# Patient Record
Sex: Male | Born: 1995
Health system: Southern US, Community
[De-identification: ages and names within clinical notes are randomized; demographics above are authoritative.]

## PROBLEM LIST (undated history)

## (undated) DIAGNOSIS — E11649 Type 2 diabetes mellitus with hypoglycemia without coma: Secondary | ICD-10-CM

## (undated) DIAGNOSIS — E063 Autoimmune thyroiditis: Secondary | ICD-10-CM

## (undated) DIAGNOSIS — E049 Nontoxic goiter, unspecified: Secondary | ICD-10-CM

## (undated) DIAGNOSIS — N62 Hypertrophy of breast: Secondary | ICD-10-CM

## (undated) HISTORY — DX: Hypertrophy of breast: N62

## (undated) HISTORY — DX: Autoimmune thyroiditis: E06.3

## (undated) HISTORY — DX: Nontoxic goiter, unspecified: E04.9

## (undated) HISTORY — DX: Type 2 diabetes mellitus with hypoglycemia without coma: E11.649

---
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
## 2014-01-08 ENCOUNTER — Encounter: Payer: Self-pay | Admitting: *Deleted

## 2014-01-08 NOTE — Progress Notes (Signed)
On 12/11/13 patient was given 3 novolog vials lot# [id number], exp 4/16. KW

## 2014-01-09 ENCOUNTER — Telehealth: Payer: Self-pay | Admitting: "Endocrinology

## 2014-01-09 NOTE — Telephone Encounter (Signed)
Made in error. David [name] °

## 2014-01-18 ENCOUNTER — Encounter: Payer: Self-pay | Admitting: *Deleted

## 2014-01-18 NOTE — Progress Notes (Signed)
On 01/09/14 patient was given 2 vials of novolog insulin, lot# [id number], exp 11/16. KW

## 2014-01-22 ENCOUNTER — Other Ambulatory Visit: Payer: Self-pay | Admitting: *Deleted

## 2014-01-22 DIAGNOSIS — E038 Other specified hypothyroidism: Secondary | ICD-10-CM

## 2014-01-22 MED ORDER — LEVOTHYROXINE SODIUM 200 MCG PO TABS: ORAL_TABLET | ORAL | Status: DC

## 2014-02-07 ENCOUNTER — Ambulatory Visit (INDEPENDENT_AMBULATORY_CARE_PROVIDER_SITE_OTHER): Payer: BC Managed Care – PPO | Admitting: "Endocrinology

## 2014-02-07 ENCOUNTER — Encounter: Payer: Self-pay | Admitting: "Endocrinology

## 2014-02-07 VITALS — BP 134/80 | HR 89 | Wt 189.0 lb

## 2014-02-07 DIAGNOSIS — E049 Nontoxic goiter, unspecified: Secondary | ICD-10-CM

## 2014-02-07 DIAGNOSIS — E063 Autoimmune thyroiditis: Secondary | ICD-10-CM | POA: Diagnosis not present

## 2014-02-07 DIAGNOSIS — E038 Other specified hypothyroidism: Secondary | ICD-10-CM | POA: Diagnosis not present

## 2014-02-07 DIAGNOSIS — E1065 Type 1 diabetes mellitus with hyperglycemia: Secondary | ICD-10-CM | POA: Diagnosis not present

## 2014-02-07 DIAGNOSIS — E10649 Type 1 diabetes mellitus with hypoglycemia without coma: Secondary | ICD-10-CM

## 2014-02-07 DIAGNOSIS — I10 Essential (primary) hypertension: Secondary | ICD-10-CM

## 2014-02-07 DIAGNOSIS — G990 Autonomic neuropathy in diseases classified elsewhere: Secondary | ICD-10-CM

## 2014-02-07 DIAGNOSIS — E1042 Type 1 diabetes mellitus with diabetic polyneuropathy: Secondary | ICD-10-CM

## 2014-02-07 DIAGNOSIS — IMO0002 Reserved for concepts with insufficient information to code with codable children: Secondary | ICD-10-CM

## 2014-02-07 LAB — T3, FREE: T3 FREE: 6.1 pg/mL — AB (ref 2.3–4.2)

## 2014-02-07 LAB — POCT GLYCOSYLATED HEMOGLOBIN (HGB A1C): HEMOGLOBIN A1C: 10.1

## 2014-02-07 LAB — GLUCOSE, POCT (MANUAL RESULT ENTRY): POC Glucose: 282 mg/dl — AB (ref 70–99)

## 2014-02-07 LAB — T4, FREE: FREE T4: 2.64 ng/dL — AB (ref 0.80–1.80)

## 2014-02-07 LAB — TSH: TSH: 0.009 u[IU]/mL — ABNORMAL LOW (ref 0.350–4.500)

## 2014-02-07 MED ORDER — LEVOTHYROXINE SODIUM 200 MCG PO TABS: ORAL_TABLET | ORAL | Status: DC

## 2014-02-07 NOTE — Progress Notes (Signed)
CC: FU of Type 1 Diabetes mellitus, hypoglycemia, hypothyroid, Hashimoto's Disease, goiter, peripheral neuropathy, and non-compliance.  HPI: David [name] is a 18 y.o. Caucasian young man. He was unaccompanied.  1. David [name] was admitted to [facility] on 12/18/05 for new onset T1DM, dehydration and ketonuria. He was initially treated with a multiple daily injection regimen of Lantus as a basal insulin and Novolog at mealtimes and at other times as needed. He was converted to an insulin pump in 2009. As he has grown to manhood he has required progressively larger doses of insulin. His compliance with checking BGs, giving boluses, and changing sites has varied significantly from time to time. Complications of his diabetes included hypoglycemia and mild peripheral neuropathy.   2. David [name] was also diagnosed with hypothyroidism secondary to Hashimoto's Thyroiditis during this period and was treated with Synthroid. As he has grown older and as the Hashimoto's Disease has gradually but progressively destroyed thyrocytes, his doses of Synthroid have accordingly increased.   3. His last visit to [facility] was on 10/26/13. In the interim he has been healthy. He is taking Synthroid 400 mcg/day (two of the 200 mcg pills) 5 days per week and 600 mg on Saturday and 400 on Sundays each weekend day (instead of 500 mg each weekend day as we had requested). At his last refill request he was inadvertently given levothyroxine. Since then his neck feels bigger and he has had intermittent pains and pressure sensations.  His insulin pump seems to be working well. He did not have his follow up TFTS drawn prior to this visit.    4. Pertinent Review of Systems: Constitutional: David [name] feels "good". He is sleeping better, but is still tired, which he ascribes to working three jobs. He is  taking in about as much caffeine as at last visit. Emotionally, things are better. 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   Eyes: Vision is good. His last diabetic eye exam was in February 2013. There were no signs of diabetic eye disease. He has not yet scheduled a FU exam, but will do so. Neck: The patient has more complaints of anterior neck swelling, soreness, tenderness, and pressure.  Heart: Heart rate is rarely inappropriately fast. Heart rate increases with exercise or other physical activity. The patient has no complaints of palpitations, irregular heat beats, chest pain, or chest pressure. Stamina is good.  Gastrointestinal: Bowel movents seem normal. The patient has no complaints of excessive hunger, acid reflux, upset stomach, stomach aches or pains, diarrhea, or constipation. Legs: Muscle mass and strength seem normal. There are no complaints of numbness, tingling, burning, or pain. No edema is noted. Feet: There are no obvious foot problems. There are no complaints of numbness, tingling, burning, or pain. No edema is noted. Hypoglycemia: David [name] has not had many low BGs. None were severe.      5. BG meter and pump printout: He is changing his insertion site every 3-4 days. He checks BGs 1-4 times per day, mostly 2-3 times. He often misses his breakfast and dinner BG checks. He boluses 0-4 times per day, usually 2-3 times. He once went up to 47 hours between boluses. He does many carb boluses and correction boluses, but not many combined carb and correction boluses. He has had six low BGs, four of which occurred about 1-2 AM after working late and after taking big boluses at dinner or later  in the evening. His average BG was 267, compared with 278 at last visit and with 246 at the prior visit.    PAST MEDICAL, FAMILY, AND SOCIAL HISTORY: 1. School and family: He works 38 hours per week at [employer] and has 2 part-time jobs as noted above. He has benefits at [employer]. 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 Dominie's brother appears to  be in remission from his bone cancer.  2. Activities: He has not been working out much lately.   3. Primary Care Pediatrician: Dr. Maryellen Pile  REVIEW OF SYSTEMS: David [name] does not have any other significant issues involving any other of his body systems.  PHYSICAL EXAM: BP 134/80 mmHg  Pulse 89  Wt 189 lb (85.73 kg)   Constitutional: David [name] appears healthy, trim, fit, and well nourished. He has lost 7 pounds since his last visit, unintentionally.  Eyes: There is no obvious arcus or proptosis. Moisture appears normal. Mouth: The oropharynx and tongue appear normal. Dentition appears to be normal for age. Oral moisture is normal. Neck: The neck appears to be visibly enlarged. No carotid bruits are noted. The thyroid gland is diffusely larger at about 25+ grams in size. The left lobe is again larger and firmer than the right. The consistency of the thyroid gland is relatively firm. The thyroid gland is not tender to palpation. Lungs: The lungs are clear to auscultation. Air movement is good. Heart: Heart rate and rhythm are regular. Heart sounds S1 and S2 are normal. I did not appreciate any pathologic cardiac murmurs. Abdomen: The abdomen is normal in size. Bowel sounds are normal. There is no obvious hepatomegaly, splenomegaly, or other mass effect.  Arms: Muscle size and bulk are normal for age. Hands: There is no obvious tremor. Phalangeal and metacarpophalangeal joints are normal. Palmar muscles are normal for age. Palmar skin is normal. Palmar moisture is also normal. Legs: Muscles appear normal for age. No edema is present. Feet: Feet are normally formed. Dorsalis pedal pulses are 1+ bilaterally.  Neurologic: Strength is normal for age in both the upper and lower extremities. Muscle tone is normal. Sensation to touch is normal in the legs and soles of the feet.  LABS:  Hemoglobin A1c today is 10.1% today, compared with 13.4% at last visit and with 10.3% at the visit prior.  Labs 10/26/13:  TSH 0.025, free T4 1.83, free T3 4.6  Labs 05/23/13: TSH 3.563, free T4 1.56, free T3 3.6, TPO antibody 958   Labs 02/21/13: TSH 8.951, free T4 1.62, free T3 3.3 (on Synthroid 375 mcg/day)  Labs 08/15/12: TSH 3.947, free T4 1.70, free T3 3.2; cholesterol 148, triglycerides 40, HDL 59, LDL 81; microalbumin/creatinine ratio 8.4; CMP normal except glucose 207.  Labs 04/11/12: TSH 20.851, free T4 0.98, free T3 2.2 on prior dose of Synthroid  Labs 12/07/11: TSH 5.658, free T4 1.19, free T3 2.9  Labs 05/13/11: TSH 0.648, Free T4 1.75, Free T3 3.4. Microalbumin/creatinine ratio was 3.0 (normal < 30). CMP was normal, except for BG of 255.  ASSESSMENT:  1. T1DM: The HbA1c is much lower, almost down to the limit set by the [facility]. He is more compliant with BG checks and a bit more compliant with boluses. He is not checking BGs frequently enough on some days to be considered safe for driving. He needs to check BGs at meals and bedtime and to take insulin boluses more consistently. The longer he goes between BG checks and boluses, the more  at risk he is for hyperglycemia and hypoglycemia. He is changing sites more frequently at every 3-4 days. If he waits to change sites until the 4th days, he has many high BGs. 2. Hypoglycemia: He has had more low BGs, especially after taking big correction boluses in the evenings. He needs a higher BG target from 8 PM to midnight. When he plans to sleep in he needs to set a lower temporary basal rate, especially after working physically.    3. Hypothyroid: He was hyperthyroid in May, so we decreased his dose of Synthroid a small amount. He appears clinically euthyroid now. He did not get labs done prior to this visit but will have them done today. 4. Thyroiditis: His thyroiditis has been acting up more frequently in the past month. I suspect that the increase in Hashimoto's disease activity was not due to taking generic levothyroxine.  5. Goiter: The goiter is larger today. The  waxing and waning of thyroid gland size and the varying degrees of firmness are consistent with evolving Hashimoto's disease activity. 6. Peripheral neuropathy: Peripheral neuropathy is not evident today. If he can control his BGs better the neuropathy will not recur. 7. Hypertension: His BP is a bit higher. He filled the prescription for lisinopril, but is not taking it. He has heard horror stories about taking BP meds and is afraid to do so. I've told him that he is on the lowest dose of any of the popular BP meds. In his case, the lisinopril will not only control BP, but will also independently protect the kidneys from the effects of high BGs. He grudgingly agrees to start the lisinopril. 8. Non-compliance: He is doing better, but still can't consistently do all that he needs to do to take care of his DM.  PLAN: 1. Diagnostic: Repeat TFTs today. Call in two weeks with BG report. 2. Therapeutic: Adjust his Synthroid plan as needed.  Start lisinopril, 2.5 mg/day. Continue lower caffeine intake. Change the evening BG target to 180 from 8 PM to midnight.  3. Patient education: BGs will improve further if he does a better a better job of performing his DM self-care tasks.  4. Follow-up: Follow-up in 3 months.  Level of Service: This visit lasted in excess of 60 minutes. More than 50% of the visit was devoted to counseling.  David StallBRENNAN,Haydyn Liddell J

## 2014-02-07 NOTE — Patient Instructions (Signed)
Follow up visit in 3 months. Please call in on a Wednesday or Sunday evening in two weeks to discuss BGs.

## 2014-02-09 ENCOUNTER — Other Ambulatory Visit: Payer: Self-pay | Admitting: *Deleted

## 2014-02-09 DIAGNOSIS — E063 Autoimmune thyroiditis: Secondary | ICD-10-CM

## 2014-04-09 ENCOUNTER — Other Ambulatory Visit: Payer: Self-pay | Admitting: *Deleted

## 2014-04-09 DIAGNOSIS — E034 Atrophy of thyroid (acquired): Secondary | ICD-10-CM

## 2014-04-17 ENCOUNTER — Telehealth: Payer: Self-pay | Admitting: "Endocrinology

## 2014-04-17 NOTE — Telephone Encounter (Signed)
Spoke to patient, samples of Novolog placed at the front. KW

## 2014-04-27 ENCOUNTER — Encounter: Payer: Self-pay | Admitting: *Deleted

## 2014-04-27 NOTE — Progress Notes (Signed)
On February 07, 2014 patient was given 3 sample Novolog vials, lot# [id number], exp 11/16

## 2014-05-17 ENCOUNTER — Ambulatory Visit: Payer: Self-pay | Admitting: "Endocrinology

## 2014-06-26 ENCOUNTER — Ambulatory Visit (INDEPENDENT_AMBULATORY_CARE_PROVIDER_SITE_OTHER): Payer: BLUE CROSS/BLUE SHIELD | Admitting: "Endocrinology

## 2014-06-26 ENCOUNTER — Ambulatory Visit: Payer: Self-pay | Admitting: "Endocrinology

## 2014-06-26 ENCOUNTER — Encounter: Payer: Self-pay | Admitting: "Endocrinology

## 2014-06-26 VITALS — BP 137/72 | HR 87 | Wt 188.7 lb

## 2014-06-26 DIAGNOSIS — E1065 Type 1 diabetes mellitus with hyperglycemia: Secondary | ICD-10-CM

## 2014-06-26 DIAGNOSIS — G990 Autonomic neuropathy in diseases classified elsewhere: Secondary | ICD-10-CM

## 2014-06-26 DIAGNOSIS — E038 Other specified hypothyroidism: Secondary | ICD-10-CM | POA: Diagnosis not present

## 2014-06-26 DIAGNOSIS — IMO0002 Reserved for concepts with insufficient information to code with codable children: Secondary | ICD-10-CM

## 2014-06-26 DIAGNOSIS — E10649 Type 1 diabetes mellitus with hypoglycemia without coma: Secondary | ICD-10-CM | POA: Diagnosis not present

## 2014-06-26 DIAGNOSIS — I10 Essential (primary) hypertension: Secondary | ICD-10-CM | POA: Diagnosis not present

## 2014-06-26 DIAGNOSIS — E1042 Type 1 diabetes mellitus with diabetic polyneuropathy: Secondary | ICD-10-CM

## 2014-06-26 DIAGNOSIS — E063 Autoimmune thyroiditis: Secondary | ICD-10-CM

## 2014-06-26 DIAGNOSIS — E049 Nontoxic goiter, unspecified: Secondary | ICD-10-CM

## 2014-06-26 LAB — POCT GLYCOSYLATED HEMOGLOBIN (HGB A1C): HEMOGLOBIN A1C: 10.3

## 2014-06-26 LAB — GLUCOSE, POCT (MANUAL RESULT ENTRY): POC Glucose: 345 mg/dl — AB (ref 70–99)

## 2014-06-26 NOTE — Progress Notes (Signed)
CC: FU of Type 1 Diabetes mellitus, hypoglycemia, hypothyroid, Hashimoto's Disease, goiter, peripheral neuropathy, and non-compliance.  HPI: David [name] is a 19 y.o. Caucasian young man. He was unaccompanied.  1. David [name] was [facility] for new onset T1DM, dehydration and ketonuria. He was initially treated with a multiple daily injection regimen of Lantus as a basal insulin and Novolog at mealtimes and at other times as needed. He was converted to an insulin pump in 2009. As he has grown to manhood he has required progressively larger doses of insulin. His compliance with checking BGs, giving boluses, and changing sites has varied significantly from time to time. Complications of his diabetes include hypoglycemia and mild peripheral neuropathy.   2. David [name] was also diagnosed with hypothyroidism secondary to Hashimoto's Thyroiditis during this period and was treated with Synthroid. As he has grown older and as the Hashimoto's Disease has gradually but progressively destroyed thyrocytes, his doses of Synthroid have accordingly increased.   3. His last visit to [facility] was on 02/07/14. In the interim he has been healthy. He lost his lisinopril prescription and did not take action to obtain a new scrip. He also did not receive our voice mail message asking him to reduce his Synthroid dose to 400 mcg/day, so he has continued taking Synthroid 400 mcg/day (two of the 200 mcg pills) 5 days per week and 500 mg on Saturdays and Sundays. He thinks that his thyroid gland is still enlarged.  His insulin pump seems to be working well.     4. Pertinent Review of Systems: Constitutional: David [name] feels "pretty good". He is sleeping well. He has not been too tired. He is still taking in a fair amount of caffeine at work, but much less when he is not working. Emotionally, things are better. 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    Eyes: Vision is good. His last diabetic eye exam was in late March. There were no signs of diabetic eye disease.  Neck: The patient has not had much anterior neck swelling recently.  Heart: Heart often beats faster and harder. Heart rate increases with exercise or other physical activity. The patient has no complaints of   irregular heat beats, chest pain, or chest pressure. Stamina is good.  Gastrointestinal: Bowel movents seem normal. The patient has no complaints of excessive hunger, acid reflux, upset stomach, stomach aches or pains, diarrhea, or constipation. Legs: Muscle mass and strength seem normal. There are no complaints of numbness, tingling, burning, or pain. No edema is noted. Feet: There are no obvious foot problems. There are no complaints of numbness, tingling, burning, or pain. No edema is noted. Hypoglycemia: David [name] has not had many low BGs. None were severe.      5. BG meter and pump printout: He is changing his insertion sites every 4-5 days. He checks BGs 0-4 times per day, mostly 2-3 times. He often misses his mealtime and bedtime BG checks. He boluses 0-3 times per day. He often goes for up to 36 hours without checking  BGs or giving boluses. He does many manual boluses, carb boluses and correction boluses, but not many combined carb and correction boluses. He has had five low BGs, four of which occurred as the first BG of the day. His average BG was 274, compared with 267 at last visit and with 278 at the prior visit.  BG  range is 55->400.  PAST MEDICAL, FAMILY, AND SOCIAL HISTORY: 1. School and family: He works 38 hours per week at [employer] and has 2 part-time jobs as noted above. He has health insurance benefits at [facility] [employer] [facility]. 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 David [name]'s brother, David [name], appears to be in remission from his bone cancer. David [name] attends [employer]. 2. Activities: David [name] has not been working out much lately.   3. Primary  Care Pediatrician: Dr. Maryellen Pile  REVIEW OF SYSTEMS: David [name] does not have any other significant issues involving any other of his body systems.  PHYSICAL EXAM: BP 137/72 mmHg  Pulse 87  Wt 188 lb 11.2 oz (85.594 kg)   Constitutional: David [name] appears healthy, trim, fit, and well nourished. He also looks tired. He has lost one pound since his last visit.  Eyes: There is no obvious arcus or proptosis. Moisture appears normal. Mouth: The oropharynx and tongue appear normal. Dentition appears to be normal for age. Oral moisture is normal. Neck: The neck appears to be visibly enlarged. No carotid bruits are noted. His strap muscles are quite large. The thyroid gland is probably smaller at about 22-23 grams in size. The consistency of the thyroid gland is relatively firm. The thyroid gland is not tender to palpation. Lungs: The lungs are clear to auscultation. Air movement is good. Heart: Heart rate and rhythm are regular. Heart sounds S1 and S2 are normal. I did not appreciate any pathologic cardiac murmurs. Abdomen: The abdomen is normal in size. Bowel sounds are normal. There is no obvious hepatomegaly, splenomegaly, or other mass effect.  Arms: Muscle size and bulk are normal for age. Hands: There is no obvious tremor. Phalangeal and metacarpophalangeal joints are normal. Palmar muscles are normal for age. Palmar skin is normal. Palmar moisture is also normal. Legs: Muscles appear normal for age. No edema is present. Feet: Feet are normally formed. Dorsalis pedal pulses are 1+ bilaterally.  Neurologic: Strength is normal for age in both the upper and lower extremities. Muscle tone is normal. Sensation to touch is normal in the legs and soles of the feet.  LABS:  Hemoglobin A1c today is 10.3% today, compared with 10.1% at last visit and with 13.4% at the visit prior.  Labs 06/26/14: Pending  Labs 02/07/14: TSH < 0.009, free T4 2.64, free T3 6.1  Labs 10/26/13: TSH 0.025, free T4 1.83, free T3  4.6  Labs 05/23/13: TSH 3.563, free T4 1.56, free T3 3.6, TPO antibody 958   Labs 02/21/13: TSH 8.951, free T4 1.62, free T3 3.3 (on Synthroid 375 mcg/day)  Labs 08/15/12: TSH 3.947, free T4 1.70, free T3 3.2; cholesterol 148, triglycerides 40, HDL 59, LDL 81; microalbumin/creatinine ratio 8.4; CMP normal except glucose 207.  Labs 04/11/12: TSH 20.851, free T4 0.98, free T3 2.2 on prior dose of Synthroid  Labs 12/07/11: TSH 5.658, free T4 1.19, free T3 2.9  Labs 05/13/11: TSH 0.648, Free T4 1.75, Free T3 3.4. Microalbumin/creatinine ratio was 3.0 (normal < 30). CMP was normal, except for BG of 255.  ASSESSMENT:  1. T1DM: The HbA1c is a bit higher. He has not been as compliant with BG checks and insulin boluses in the past month.  He needs to check BGs at meals and bedtime and to take insulin boluses more consistently. The longer he goes between BG checks and boluses, the more at risk he is for hyperglycemia and hypoglycemia. He is changing sites less frequently at every 4-5 days. If he  waits to change sites until the 4th days, he has many high BGs. 2. Hypoglycemia: He has had several low BGs, mostly if he does not check BGs often enough and sleeps in later than usual. When he plans to sleep in he needs to set a lower temporary basal rate, especially after working physically.    3. Hypothyroid: He was hyperthyroid in May, so we decreased his dose of Synthroid a small amount. He was again hyperthyroid in December, but he was also having a flare up of Hashimoto's disease at the time.  He appears clinically euthyroid now. He did not get labs done prior to this visit but will have them done today. 4. Thyroiditis: His thyroiditis has been fairly quiescent recently.   5. Goiter: The goiter is smaller today. The waxing and waning of thyroid gland size and the varying degrees of firmness are consistent with evolving Hashimoto's disease activity. 6. Peripheral neuropathy: Peripheral neuropathy is not evident  today. If he can control his BGs better the neuropathy will not recur. 7. Hypertension: His BP is a bit better today. He did not start on lisinopril, but will do so today.  8. Non-compliance: He is doing better, but still can't consistently do all that he needs to do to take care of his DM. I suggested setting alarms on his cell phone so that he will not forget to check BGs at mealtimes and bedtime.   PLAN: 1. Diagnostic: Repeat TFTs today. Call in two weeks on a Sunday evening with BG report. 2. Therapeutic: Adjust his Synthroid plan as needed.  Start lisinopril, 2.5 mg/day. Continue lower caffeine intake.  3. Patient education: BGs will improve further if he does a better a better job of performing his DM self-care tasks.  4. Follow-up: Follow-up in 3 months.  Level of Service: This visit lasted in excess of 60 minutes. More than 50% of the visit was devoted to counseling.  David Stall

## 2014-06-26 NOTE — Patient Instructions (Signed)
Follow up visit in 3 months. Please call Dr. Fransico MichaelBrennan in two weeks on a Sunday evening between 7-10 PM to discuss BGs.

## 2014-06-27 LAB — T3, FREE: T3, Free: 5.1 pg/mL — ABNORMAL HIGH (ref 2.3–4.2)

## 2014-06-27 LAB — TSH: TSH: <0.008 u[IU]/mL — ABNORMAL LOW (ref 0.350–4.500)

## 2014-06-27 LAB — HEMOGLOBIN A1C
Hgb A1c MFr Bld: 10.6 % — ABNORMAL HIGH (ref <5.7)
Mean Plasma Glucose: 258 mg/dL — ABNORMAL HIGH (ref <117)

## 2014-06-27 LAB — T4, FREE: Free T4: 2.21 ng/dL — ABNORMAL HIGH (ref 0.80–1.80)

## 2014-07-03 ENCOUNTER — Telehealth: Payer: Self-pay | Admitting: *Deleted

## 2014-07-03 ENCOUNTER — Other Ambulatory Visit: Payer: Self-pay | Admitting: *Deleted

## 2014-07-03 DIAGNOSIS — E034 Atrophy of thyroid (acquired): Secondary | ICD-10-CM

## 2014-07-03 NOTE — Telephone Encounter (Signed)
LVM, advised that per Dr. Fransico MichaelBrennan TFTs are still high. Reduce Synthroid to 400 mcg per day on 4 days per week and 300 mcg/day on three days per week, such as Monday-Wednesday-Friday. Repeat TFTs in 3 months, labs are in the portal.

## 2014-07-20 ENCOUNTER — Other Ambulatory Visit: Payer: Self-pay | Admitting: "Endocrinology

## 2014-10-01 ENCOUNTER — Ambulatory Visit: Payer: Self-pay | Admitting: "Endocrinology

## 2014-10-01 ENCOUNTER — Other Ambulatory Visit: Payer: Self-pay | Admitting: "Endocrinology

## 2014-10-27 ENCOUNTER — Other Ambulatory Visit: Payer: Self-pay | Admitting: "Endocrinology

## 2014-11-08 ENCOUNTER — Encounter: Payer: Self-pay | Admitting: "Endocrinology

## 2014-11-08 ENCOUNTER — Ambulatory Visit (INDEPENDENT_AMBULATORY_CARE_PROVIDER_SITE_OTHER): Payer: BLUE CROSS/BLUE SHIELD | Admitting: "Endocrinology

## 2014-11-08 VITALS — BP 123/72 | HR 72 | Wt 187.7 lb

## 2014-11-08 DIAGNOSIS — E10649 Type 1 diabetes mellitus with hypoglycemia without coma: Secondary | ICD-10-CM | POA: Diagnosis not present

## 2014-11-08 DIAGNOSIS — IMO0002 Reserved for concepts with insufficient information to code with codable children: Secondary | ICD-10-CM

## 2014-11-08 DIAGNOSIS — E038 Other specified hypothyroidism: Secondary | ICD-10-CM | POA: Diagnosis not present

## 2014-11-08 DIAGNOSIS — E0580 Other thyrotoxicosis without thyrotoxic crisis or storm: Secondary | ICD-10-CM

## 2014-11-08 DIAGNOSIS — Z9119 Patient's noncompliance with other medical treatment and regimen: Secondary | ICD-10-CM

## 2014-11-08 DIAGNOSIS — Z91199 Patient's noncompliance with other medical treatment and regimen due to unspecified reason: Secondary | ICD-10-CM

## 2014-11-08 DIAGNOSIS — E1065 Type 1 diabetes mellitus with hyperglycemia: Secondary | ICD-10-CM | POA: Diagnosis not present

## 2014-11-08 DIAGNOSIS — E063 Autoimmune thyroiditis: Secondary | ICD-10-CM

## 2014-11-08 DIAGNOSIS — E1042 Type 1 diabetes mellitus with diabetic polyneuropathy: Secondary | ICD-10-CM

## 2014-11-08 DIAGNOSIS — F4320 Adjustment disorder, unspecified: Secondary | ICD-10-CM

## 2014-11-08 DIAGNOSIS — I10 Essential (primary) hypertension: Secondary | ICD-10-CM

## 2014-11-08 DIAGNOSIS — E049 Nontoxic goiter, unspecified: Secondary | ICD-10-CM

## 2014-11-08 DIAGNOSIS — G990 Autonomic neuropathy in diseases classified elsewhere: Secondary | ICD-10-CM

## 2014-11-08 LAB — POCT GLYCOSYLATED HEMOGLOBIN (HGB A1C): HEMOGLOBIN A1C: 9.9

## 2014-11-08 LAB — GLUCOSE, POCT (MANUAL RESULT ENTRY): POC Glucose: 156 mg/dl — AB (ref 70–99)

## 2014-11-08 NOTE — Progress Notes (Signed)
CC: FU of Type 1 Diabetes mellitus, hypoglycemia, acquired autoimmune hypothyroidism, Hashimoto's Disease, goiter, peripheral neuropathy, and adjustment reaction/noncompliance.  HPI: [name] is a 19 y.o. Caucasian young man. He was unaccompanied.  1. [name] was admitted to [facility] on 12/18/05 for new onset T1DM, dehydration and ketonuria. He was initially treated with a multiple daily injection regimen of Lantus as a basal insulin and Novolog at mealtimes and at other times as needed. He was converted to an insulin pump in 2009. As he has grown to manhood he has required progressively larger doses of insulin. His compliance with checking BGs, giving boluses, and changing sites has varied significantly from time to time. Complications of his diabetes include hypoglycemia and mild peripheral neuropathy.   2. [name] was also diagnosed with hypothyroidism secondary to Hashimoto's Thyroiditis during this period and was treated with Synthroid. As he has grown older and as the Hashimoto's Disease has gradually but progressively destroyed thyrocytes, his doses of Synthroid have accordingly increased.   3. His last visit to [facility] was on 06/26/14. In the interim he has been healthy. He lis taking his lisinopril, 2.5 mg/day. After receiving our voicemail message on 07/02/13 he reduced his Synthroid dose to 400 mcg/day for 4 days each week and 300 mg/day on three days each week. He has not noted more fatigue since reducing the Synthroid dose. His insulin pump seems to be working well.     4. Pertinent Review of Systems: Constitutional: [name] feels "pretty good". He is sleeping well. He has not been too tired. 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 He has reduced his caffeine intake quite a bit. Emotionally, things are pretty good. He is working full-time at [employer] station in [employer] [place] and part-time at [employer] station in [place]. He is looking for more part-time work.     Eyes: Vision is good.  His last diabetic eye exam was in late March 2016. There were no signs of diabetic eye disease.  Neck: The patient has not had much anterior neck swelling recently.  Heart: Heart often beats faster and harder. Heart rate increases with exercise or other physical activity. The patient has no complaints of irregular heat beats, chest pain, or chest pressure. Stamina is good.  Gastrointestinal: Bowel movents seem normal. The patient has no complaints of excessive hunger, acid reflux, upset stomach, stomach aches or pains, diarrhea, or constipation. Legs: Muscle mass and strength seem normal. There are no complaints of numbness, tingling, burning, or pain. No edema is noted. Feet: There are no obvious foot problems. There are no complaints of numbness, tingling, burning, or pain. No edema is noted. Hypoglycemia: [name] has not had many low BGs. None were severe.      5. BG meter and pump printout: He is changing his insertion sites every 3-4 days, mostly every 4 days. He checks BGs 0-4 times per day, mostly 2-3 times. He often misses his mealtime and bedtime BG checks. He boluses 0-4 times per day, mostly 1-2 times per day . He sometimes goes for up to 48 hours without checking  BGs or giving boluses. He does many manual boluses, carb boluses and correction boluses, but not many combined carb and correction boluses. He has had 13 low BGs, 7 of which occurred as the first BG of the day. His average BG was 199, compared with 274 at last visit and with 267 at the prior visit.  BG range is 45 - >400.  PAST MEDICAL,  FAMILY, AND SOCIAL HISTORY: 1. School and family: He now works full-time for the [employer]. 2. Activities: [name] is more physically active at work now. He does not use alcohol or illicit drugs.   3. Primary Care Pediatrician: none  REVIEW OF SYSTEMS: [name] does not have any other significant issues involving any of his other body systems.  PHYSICAL EXAM: BP 123/72 mmHg   Pulse 72  Wt 187 lb 11.2 oz (85.14 kg)   Constitutional: [name] appears healthy, trim, fit, and well nourished. He has lost 4 oz. since his last visit. He looks rested today.   Eyes: There is no obvious arcus or proptosis. Moisture appears normal. Mouth: The oropharynx and tongue appear normal. Dentition appears to be normal for age. Oral moisture is normal. Neck: The neck appears to be visibly enlarged. No carotid bruits are noted. His strap muscles are quite large. The thyroid gland is larger at 23-24 grams in size. The consistency of the thyroid gland is relatively firm. The thyroid gland is not tender to palpation. Lungs: The lungs are clear to auscultation. Air movement is good. Heart: Heart rate and rhythm are regular. Heart sounds S1 and S2 are normal. I did not appreciate any pathologic cardiac murmurs. Abdomen: The abdomen is normal in size. Bowel sounds are normal. There is no obvious hepatomegaly, splenomegaly, or other mass effect.  Arms: Muscle size and bulk are normal for age. Hands: There is no obvious tremor. Phalangeal and metacarpophalangeal joints are normal. Palmar muscles are normal for age. Palmar skin is normal. Palmar moisture is also normal. Legs: Muscles appear normal for age. No edema is present. Feet: Feet are normally formed. Dorsalis pedal pulses are 1+ bilaterally.  Neurologic: Strength is normal for age in both the upper and lower extremities. Muscle tone is normal. Sensation to touch is normal in the legs and soles of the feet.  LABS:    Labs 11/08/14: HA1c 9.9%  Labs 10/25/14: CBC normal, except for WBC 3.2 and neutrophil count 1.2; CMP normal except for glucose 162 and bilirubin 2.0; cholesterol 118, triglycerides 40, HDL 48, LDL 62; TSH 0.005, T4 14.2 (4.5-12.00)  Labs 06/26/14: Hemoglobin A1c 10.3% today, compared with 10.1% at last visit and with 13.4% at the visit prior. TSH < 0.008, free T4 2.21, free T3 5.1  Labs 02/07/14: TSH < 0.009, free T4 2.64, free  T3 6.1  Labs 10/26/13: TSH 0.025, free T4 1.83, free T3 4.6  Labs 05/23/13: TSH 3.563, free T4 1.56, free T3 3.6, TPO antibody 958   Labs 02/21/13: TSH 8.951, free T4 1.62, free T3 3.3 (on Synthroid 375 mcg/day)  Labs 08/15/12: TSH 3.947, free T4 1.70, free T3 3.2; cholesterol 148, triglycerides 40, HDL 59, LDL 81; microalbumin/creatinine ratio 8.4; CMP normal except glucose 207.  Labs 04/11/12: TSH 20.851, free T4 0.98, free T3 2.2 on prior dose of Synthroid  Labs 12/07/11: TSH 5.658, free T4 1.19, free T3 2.9  Labs 05/13/11: TSH 0.648, Free T4 1.75, Free T3 3.4. Microalbumin/creatinine ratio was 3.0 (normal < 30). CMP was normal, except for BG of 255.  ASSESSMENT:  1. T1DM: The HbA1c is lower, in part due to him having more frequent low BGs. He has not been as compliant with BG checks and insulin boluses in the past month as he needed to be.  He needs to check BGs at meals and bedtime and to take insulin boluses more consistently. The longer he goes between BG checks and boluses, the more at  risk he is for hyperglycemia and hypoglycemia. He is changing sites more frequently at every 3-4 days. If he waits to change sites until the 4th days, he has many high BGs. 2. Hypoglycemia: He has had many more low BGs, partly due to sometimes not checking BGs often enough late at night or during the days, but also partly due to being more physically active at work. Marland KitchenHe needs lower basal rates throughout the 24-hour period.     3. Hypothyroid: He was hyperthyroid in April, so we decreased his dose of Synthroid dose. His recent thyroid tests two weeks ago are still hyperthyroid, so we need to reduce his Synthroid dose further. He appears clinically euthyroid now. He appears to be taking his Synthroid consistently now.  4. Thyroiditis: His thyroiditis has been fairly quiescent recently.   5. Goiter: The goiter is larger today. The process of waxing and waning of thyroid gland size and degrees of firmness are  consistent with evolving Hashimoto's disease activity. 6. Peripheral neuropathy: Peripheral neuropathy is not evident today. If he can control his BGs better the neuropathy will not recur. 7. Hypertension: His BP is good today on low dose lisinopril therapy.   8. Non-compliance: He is doing somewhat better, but is still not as consistent as he needs to be. I again suggested setting alarms on his cell phone so that he will not forget to check BGs at mealtimes and bedtime. I also suggested several other prompts such as having a 3x5 card in his wallet, a note in his lunch box, and another reminder at his place on the kitchen table.  He said that he will do so. If he does check his BGs and take boluses more frequently, I suspect that we will need to reduce his basal rates and bolus doses more.   PLAN: 1. Diagnostic: Call on 11/21/14 to discuss BGs. Repeat TFTS in 2 months. 2. Therapeutic: Reduce the Synthroid to 300 mcg/day. Adjust his Synthroid plan as needed.  Continue lisinopril, 2.5 mg/day. Continue lower caffeine intake.  New basal rates: MN: 1.25 -> 1.05 4:30 AM: 1.90 -> 1.70 8 AM: 1.30 -> 1.20 2 PM: 1.55 -> 1.40 8 PM: 1.35 -> 1.25 3. Patient education: BGs will improve further if he does a better a better job of performing his DM self-care tasks.  4. Follow-up: Follow-up with me in two months.  Level of Service: This visit lasted in excess of 70 minutes. More than 50% of the visit was devoted to counseling.  David Stall

## 2014-11-08 NOTE — Patient Instructions (Addendum)
Follow up visit with me in two months. Please call Dr.Kristine Tiley between 8:00-9:30 PM on 11/21/14 to discuss BGs.

## 2014-11-21 ENCOUNTER — Telehealth: Payer: Self-pay | Admitting: "Endocrinology

## 2014-11-21 NOTE — Telephone Encounter (Signed)
Received telephone call from [name] 1. Overall status: Things are pretty good. He gives himself correction boluses based upon the BG level, whether it is preprandial or postprandial. He does not do carb counts and food boluses. 2. New problems: He had a 67 in the morning on 11/17/14. 3. Last site change: 11/20/14 4. Rapid-acting insulin: Novolog in his Medtronic pump 5. BG log: 2 AM, Breakfast, Lunch, Supper, Bedtime 11/19/14: xxx, 185, postprandial 271; postprandial 203, pending 11/20/14: xxx, 196, xxx, 209, xxx 11/21/14: xxx, xxx, postprandial 253, 313, pending 6. Assessment: His current method for bolusing is not working well. 7. Plan: Check BGs before meals and do both a correction bolus and a food bolus. 8. FU call: next Wednesday. David Stall

## 2014-12-17 ENCOUNTER — Ambulatory Visit (INDEPENDENT_AMBULATORY_CARE_PROVIDER_SITE_OTHER): Payer: Commercial Managed Care - HMO | Admitting: Family

## 2014-12-17 ENCOUNTER — Encounter: Payer: Self-pay | Admitting: Family

## 2014-12-17 VITALS — BP 124/75 | HR 77 | Wt 186.6 lb

## 2014-12-17 DIAGNOSIS — E162 Hypoglycemia, unspecified: Secondary | ICD-10-CM | POA: Diagnosis not present

## 2014-12-17 DIAGNOSIS — Z9119 Patient's noncompliance with other medical treatment and regimen: Secondary | ICD-10-CM | POA: Diagnosis not present

## 2014-12-17 DIAGNOSIS — E049 Nontoxic goiter, unspecified: Secondary | ICD-10-CM

## 2014-12-17 DIAGNOSIS — E109 Type 1 diabetes mellitus without complications: Secondary | ICD-10-CM | POA: Diagnosis not present

## 2014-12-17 DIAGNOSIS — Z91199 Patient's noncompliance with other medical treatment and regimen due to unspecified reason: Secondary | ICD-10-CM

## 2014-12-17 DIAGNOSIS — Z23 Encounter for immunization: Secondary | ICD-10-CM

## 2014-12-17 LAB — GLUCOSE, POCT (MANUAL RESULT ENTRY): POC GLUCOSE: 236 mg/dL — AB (ref 70–99)

## 2014-12-17 NOTE — Patient Instructions (Addendum)
Goals  - Check blood sugar 4 times per day  - Add blood sugar into bolus wizard to give correction.  - Goal A1C below 9 to start!  - Labs prior to next.   MyChart message me in one week with you blood sugars.

## 2014-12-17 NOTE — Progress Notes (Signed)
Patient ID: David [name], male   DOB: 1996/02/10, 19 y.o.   MRN: [id number]  Expand All Collapse All   CC: FU of Type 1 Diabetes mellitus, hypoglycemia, acquired autoimmune hypothyroidism, Hashimoto's Disease, goiter, peripheral neuropathy, and adjustment reaction/noncompliance.  HPI: David is a 19 y.o. Caucasian young man. He was unaccompanied.  1. David was admitted to [facility] on 12/18/05 for new onset T1DM, dehydration and ketonuria. He was initially treated with a multiple daily injection regimen of Lantus as a basal insulin and Novolog at mealtimes and at other times as needed. He was converted to an insulin pump in 2009. As he has grown to manhood he has required progressively larger doses of insulin. His compliance with checking BGs, giving boluses, and changing sites has varied significantly from time to time. Complications of his diabetes include hypoglycemia and mild peripheral neuropathy.   2. David was also diagnosed with hypothyroidism secondary to Hashimoto's Thyroiditis during this period and was treated with Synthroid. As he has grown older and as the Hashimoto's Disease has gradually but progressively destroyed thyrocytes, his doses of Synthroid have accordingly increased.   3. His last visit to [facility] was on 09/16. In the interim he has been healthy. He has continued to take his lisinopril and also his reduced dose of synthroid. He feels that his blood sugars have been worse since he was last seen, he feels that he has been higher due to having his basal rate reduced. Upon discussing this more with David, he admits that he frequently does not check his blood sugars when eating and only doses his insulin to cover carbs, he even forgets to do this at times. He knows this is a bad habit and it is negatively impacting his diabetes care and his work as a IT sales professional.  He lis taking his lisinopril, 2.5 mg/day. After receiving our voicemail message on 07/02/13 he reduced his Synthroid dose to  400 mcg/day for 4 days each week and 300 mg/day on three days each week. He has not noted more fatigue since reducing the Synthroid dose. His insulin pump seems to be working well.David states that he sometimes runs his blood sugars higher because he is afraid that he will go low while out on a call, however, he always remembers to carry glucose. He states that his food mainly consist of fast food for lunch and dinner and [employer] for breakfast. He exercises for about an hour 4 days a week.   4. Pertinent Review of Systems: Constitutional: David feels "pretty good". He is sleeping well. He has not been too tired. 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 He has reduced his caffeine intake quite a bit. Emotionally, things are pretty good. He is working full-time at [employer] station in [employer] and part-time at [employer] station in [place]. He is looking for more part-time work.  Eyes: Vision is good. His last diabetic eye exam was in late July 2016. There were no signs of diabetic eye disease.  Neck: The patient has not had much anterior neck swelling recently.  Heart: Heart often beats faster and harder. Heart rate increases with exercise or other physical activity. The patient has no complaints of irregular heat beats, chest pain, or chest pressure. Stamina is good.  Gastrointestinal: Bowel movents seem normal. The patient has no complaints of excessive hunger, acid reflux, upset stomach, stomach aches or pains, diarrhea, or constipation. Legs: Muscle mass and strength seem normal. There are no complaints  of numbness, tingling, burning, or pain. No edema is noted. Feet: There are no obvious foot problems. There are no complaints of numbness, tingling, burning, or pain. No edema is noted. Hypoglycemia: David has not had many low BGs. None were severe.   5. BG meter and pump printout: He checks 1-5 times per day, average of 2. 4 test per day. He admits to not checking  before meals frequently. He is bolusing 2.2 times per day. He changes his site every 3-4 days. He has 5 low blood glucose recorded, only one is below 70. He is above target 64% of the time. His basal insulin is 68% of his total daily insulin. Average blood glucose is 241 +-114. Range is 65- >400   PAST MEDICAL, FAMILY, AND SOCIAL HISTORY: 1. School and family: He now works full-time for the [employer]. 2. Activities: David is more physically active at work now. He does not use alcohol or illicit drugs.  3. Primary Care Pediatrician: none  REVIEW OF SYSTEMS: David does not have any other significant issues involving any of his other body systems.  PHYSICAL EXAM: Blood pressure 124/75, pulse 77, weight 186 lb 9.6 oz (84.641 kg). Facility age limit for growth percentiles is 20 years.  Wt Readings from Last 3 Encounters:  12/17/14 186 lb 9.6 oz (84.641 kg)  11/08/14 187 lb 11.2 oz (85.14 kg)  06/26/14 188 lb 11.2 oz (85.594 kg)   Ht Readings from Last 3 Encounters:  08/15/12 6' 0.44" (1.84 m) (86 %*, Z = 1.08)  12/09/11 6' 0.24" (1.835 m) (86 %*, Z = 1.06)  08/18/11 6' 0.52" (1.842 m) (88 %*, Z = 1.19)   * Growth percentiles are based on CDC 2-20 Years data.   Body mass index is 25 kg/(m^2). @BMIFA @ Normalized weight-for-age data available only for age 60 to 20 years. Normalized stature-for-age data available only for age 60 to 20 years.   Constitutional: David appears healthy, alert and interactive.  Eyes: There is no obvious arcus or proptosis. Moisture appears normal. Mouth: The oropharynx and tongue appear normal. Dentition appears to be normal for age. Oral moisture is normal. Neck: The neck appears to be visibly enlarged. No carotid bruits are noted. His strap muscles are quite large. The thyroid gland is larger at 23-24 grams in size. The consistency of the thyroid gland is relatively firm. The thyroid gland is not tender to palpation. Lungs: The lungs  are clear to auscultation. Air movement is good. Heart: Heart rate and rhythm are regular. Heart sounds S1 and S2 are normal. I did not appreciate any pathologic cardiac murmurs. Abdomen: The abdomen is normal in size. Bowel sounds are normal. There is no obvious hepatomegaly, splenomegaly, or other mass effect.  Arms: Muscle size and bulk are normal for age. Hands: There is no obvious tremor. Phalangeal and metacarpophalangeal joints are normal. Palmar muscles are normal for age. Palmar skin is normal. Palmar moisture is also normal. Legs: Muscles appear normal for age. No edema is present. Feet: Feet are normally formed. Dorsalis pedal pulses are 1+ bilaterally.  Neurologic: Strength is normal for age in both the upper and lower extremities. Muscle tone is normal. Sensation to touch is normal in the legs and soles of the feet.  LABS: Results for orders placed or performed in visit on 12/17/14  POCT Glucose (CBG)  Result Value Ref Range   POC Glucose 236 (A) 70 - 99 mg/dl     Labs 9/60/45: [id number] 9.9%  Labs  10/25/14: CBC normal, except for WBC 3.2 and neutrophil count 1.2; CMP normal except for glucose 162 and bilirubin 2.0; cholesterol 118, triglycerides 40, HDL 48, LDL 62; TSH 0.005, T4 14.2 (4.5-12.00)  Labs 06/26/14: Hemoglobin A1c 10.3% today, compared with 10.1% at last visit and with 13.4% at the visit prior. TSH < 0.008, free T4 2.21, free T3 5.1  Labs 02/07/14: TSH < 0.009, free T4 2.64, free T3 6.1  Labs 10/26/13: TSH 0.025, free T4 1.83, free T3 4.6  Labs 05/23/13: TSH 3.563, free T4 1.56, free T3 3.6, TPO antibody 958   Labs 02/21/13: TSH 8.951, free T4 1.62, free T3 3.3 (on Synthroid 375 mcg/day)  Labs 08/15/12: TSH 3.947, free T4 1.70, free T3 3.2; cholesterol 148, triglycerides 40, HDL 59, LDL 81; microalbumin/creatinine ratio 8.4; CMP normal except glucose 207.  Labs 04/11/12: TSH 20.851, free T4 0.98, free T3 2.2 on prior dose of Synthroid  Labs 12/07/11: TSH 5.658,  free T4 1.19, free T3 2.9  Labs 05/13/11: TSH 0.648, Free T4 1.75, Free T3 3.4. Microalbumin/creatinine ratio was 3.0 (normal < 30). CMP was normal, except for BG of 255.  ASSESSMENT:  1. T1DM: David appears to be riding his basal insulin for the control he does have. He admits to frequently forgetting to bolus and rarely includes his blood sugar level for his insulin dosages. He knows not checking his blood sugar is a problem of his. He would like to increase his basal back but understands that if he were to bolus properly, he would have better control of his blood sugars. He admits to one episode of hypoglycemia with symptoms, he was 64 and was shaky and dizzy. 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  2. Hypoglycemia: His frequency of low blood sugars had decreased since his last visit. He has five glucose readings classified as low, the lowest was 65.   3. Hypothyroid: He was hyperthyroid in April, so we decreased his dose of Synthroid dose. His recent thyroid tests two weeks ago are still hyperthyroid, so we need to reduce his Synthroid dose further. He appears clinically euthyroid now. He appears to be taking his Synthroid consistently now. Will make no changes.  4. Thyroiditis: His thyroiditis has been fairly quiescent recently.  5. Goiter: The goiter is large. The process of waxing and waning of thyroid gland size and degrees of firmness are consistent with evolving Hashimoto's disease activity. 6. Peripheral neuropathy: Peripheral neuropathy is not evident today. If he can control his BGs better the neuropathy will not recur. 7. Hypertension: His BP is good today on low dose lisinopril therapy.  8. Non-compliance: David is very open about his short falls when it comes to diabetes control. He understands the mistakes he makes and knows that he needs to improve. He wants to set goals and be held accountable for meeting them.   PLAN: 1.  Diagnostic: Labs as above.  2. Therapeutic: Continue synthroid and Lisinopril dose. Will repeat labs on next visit including his yearly labs: lipid panel, CBC, CMP, TSH, T4 Continue current basal rates at this time. Will work on bolusing properly. If we increase basal and then he decides to bolus properly, he will have more lows which we want to avoid.  GOALS set by David - Check blood sugar 4 times per day  - Add blood sugar into bolus wizard to give correction.  - Goal A1C below 9 to start!  - Labs  prior to next.  3. Patient education: Discussed importance of proper diabetes care including bolusing and checking his blood sugars often. 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 He understands that he is trying to save peoples lives, he has to take care of himself as well.  4. Follow-up: Follow-up in one month. He will MyChart message me frequently, at least once a week with his blood sugars.  Level of Service: This visit lasted in excess of 45 minutes. More than 50% of the visit was devoted to counseling.

## 2014-12-24 ENCOUNTER — Other Ambulatory Visit: Payer: Self-pay | Admitting: *Deleted

## 2014-12-24 ENCOUNTER — Encounter: Payer: Self-pay | Admitting: Family

## 2014-12-24 DIAGNOSIS — E109 Type 1 diabetes mellitus without complications: Secondary | ICD-10-CM

## 2014-12-24 MED ORDER — INSULIN LISPRO 100 UNIT/ML ~~LOC~~ SOLN: SUBCUTANEOUS | Status: DC

## 2014-12-30 ENCOUNTER — Encounter: Payer: Self-pay | Admitting: Family

## 2014-12-31 ENCOUNTER — Telehealth: Payer: Self-pay | Admitting: Family

## 2014-12-31 NOTE — Telephone Encounter (Signed)
Spoke to patient. Advised that his insurance is requiring humalog, we sent a script in for him.

## 2015-01-04 ENCOUNTER — Other Ambulatory Visit: Payer: Self-pay | Admitting: "Endocrinology

## 2015-01-07 ENCOUNTER — Encounter: Payer: Self-pay | Admitting: Family

## 2015-01-07 ENCOUNTER — Telehealth: Payer: Self-pay | Admitting: Family

## 2015-01-07 NOTE — Telephone Encounter (Signed)
Routed to provider

## 2015-01-10 NOTE — Telephone Encounter (Signed)
Called patient twice of the last two days and left messages on his listed number.

## 2015-01-17 ENCOUNTER — Ambulatory Visit: Payer: Self-pay | Admitting: "Endocrinology

## 2015-01-17 ENCOUNTER — Ambulatory Visit: Payer: Self-pay | Admitting: Family

## 2015-01-23 ENCOUNTER — Ambulatory Visit (INDEPENDENT_AMBULATORY_CARE_PROVIDER_SITE_OTHER): Payer: Commercial Managed Care - HMO | Admitting: Family

## 2015-01-23 ENCOUNTER — Encounter: Payer: Self-pay | Admitting: *Deleted

## 2015-01-23 ENCOUNTER — Encounter: Payer: Self-pay | Admitting: Family

## 2015-01-23 VITALS — BP 131/74 | HR 77 | Wt 189.0 lb

## 2015-01-23 DIAGNOSIS — E039 Hypothyroidism, unspecified: Secondary | ICD-10-CM | POA: Diagnosis not present

## 2015-01-23 DIAGNOSIS — E109 Type 1 diabetes mellitus without complications: Secondary | ICD-10-CM | POA: Diagnosis not present

## 2015-01-23 DIAGNOSIS — F4320 Adjustment disorder, unspecified: Secondary | ICD-10-CM | POA: Insufficient documentation

## 2015-01-23 DIAGNOSIS — IMO0001 Reserved for inherently not codable concepts without codable children: Secondary | ICD-10-CM

## 2015-01-23 DIAGNOSIS — E1065 Type 1 diabetes mellitus with hyperglycemia: Principal | ICD-10-CM

## 2015-01-23 DIAGNOSIS — E049 Nontoxic goiter, unspecified: Secondary | ICD-10-CM

## 2015-01-23 LAB — GLUCOSE, POCT (MANUAL RESULT ENTRY): POC Glucose: 173 mg/dl — AB (ref 70–99)

## 2015-01-23 LAB — POCT GLYCOSYLATED HEMOGLOBIN (HGB A1C): HEMOGLOBIN A1C: 10

## 2015-01-23 NOTE — Patient Instructions (Signed)
Mandatory Goals.  - 4 blood sugar test per day. First thing in morning, afternoon, Evening, and before bed.  - At least 3 boluses per day.  - Email me blood sugars every Sunday.   Goals  - Look at CGM and start wearing consistently.

## 2015-01-23 NOTE — Progress Notes (Signed)
Patient ID: David [name] C Clouse, male   DOB: 01-05-1996, 19 y.o.   MRN: [id number] HPI: David [name] is a 19 y.o. Caucasian young man. He was unaccompanied.  1. David [name] was admitted to [facility] on 12/18/05 for new onset T1DM, dehydration and ketonuria. He was initially treated with a multiple daily injection regimen of Lantus as a basal insulin and Novolog at mealtimes and at other times as needed. He was converted to an insulin pump in 2009. As he has grown to manhood he has required progressively larger doses of insulin. His compliance with checking BGs, giving boluses, and changing sites has varied significantly from time to time. Complications of his diabetes include hypoglycemia and mild peripheral neuropathy.   2. David [name] was also diagnosed with hypothyroidism secondary to Hashimoto's Thyroiditis during this period and was treated with Synthroid. As he has grown older and as the Hashimoto's Disease has gradually but progressively destroyed thyrocytes, his doses of Synthroid have accordingly increased.   3. His last visit to [facility] was on 10/16. In the interim he has been healthy. David [name] reports that he was forced to switch to Humalog by his insurance carrier, since that time he feels that he has been using more insulin and his blood sugars have been higher overall. He keeps his insulin stored properly and has tried using a different bottle but had the same issue. Overall, David [name] feels that he needs to do better with his diabetes. He states that he sometimes just gets busy and doesn't check his blood sugars like he should. He also only boluses for food when he eats big meals. He knows that the combination of not bolusing and checking blood glucose consistently are leading to his difficulty with his diabetes control. David [name] reports that he really wants to do better and is willing to any suggestions and help that can be given. 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 He is preparing his diabetes supplies and getting extra glucose to keep with him during his trip. He continues to take Lisinopril 2.5mg /day and Synthroid 458mcg/day for 4 days each week and 373mcg/day on three days each week. He reports that he is feeling well overall and just wants to do better.   4. Pertinent Review of Systems: Constitutional: David [name] feels "pretty good".  Eyes: Vision is good. His last diabetic eye exam was in late July 2016. There were no signs of diabetic eye disease.  Neck: The patient has not had much anterior neck swelling recently.  Heart: Heart often beats faster and harder. Heart rate increases with exercise or other physical activity. The patient has no complaints of irregular heat beats, chest pain, or chest pressure. Stamina is good.  Gastrointestinal: Bowel movents seem normal. The patient has no complaints of excessive hunger, acid reflux, upset stomach, stomach aches or pains, diarrhea, or constipation. Legs: Muscle mass and strength seem normal. There are no complaints of numbness, tingling, burning, or pain. No edema is noted. Feet: There are no obvious foot problems. There are no complaints of numbness, tingling, burning, or pain. No edema is noted. Hypoglycemia: David [name] has not had many low BGs. None were severe.   5. BG meter and pump printout: Checks 1-4 times per day, average 2.8 checks per day. Two days with no checks. He is bolusing 2.4 times per day. BG range 58 - >400Changes site every 3-4 days. Reports no low blood sugars and that he has  been running higher the normal since switching to Humalog.   Last Visit: He checks 1-5 times per day, average of 2.4 test per day. He admits to not checking before meals frequently. He is bolusing 2.2 times per day. He changes his site every 3-4 days. He has 5 low blood glucose recorded, only one is below 70. He is above target 64% of the time. His basal insulin is 68% of his total  daily insulin. Average blood glucose is 241 +-114. Range is 65- >400   PAST MEDICAL, FAMILY, AND SOCIAL HISTORY: 1. School and family: He now works full-time for the [employer]. 2. Activities: David [name] is more physically active at work now. He does not use alcohol or illicit drugs.  3. Primary Care Pediatrician: none  REVIEW OF SYSTEMS: David [name] does not have any other significant issues involving any of his other body systems.  PHYSICAL EXAM:  Blood pressure 131/74, pulse 77, weight 189 lb (85.73 kg). Facility age limit for growth percentiles is 20 years. Wt Readings from Last 3 Encounters:  01/23/15 189 lb (85.73 kg)  12/17/14 186 lb 9.6 oz (84.641 kg)  11/08/14 187 lb 11.2 oz (85.14 kg)   Ht Readings from Last 3 Encounters:  08/15/12 6' 0.44" (1.84 m) (86 %*, Z = 1.08)  12/09/11 6' 0.24" (1.835 m) (86 %*, Z = 1.06)  08/18/11 6' 0.52" (1.842 m) (88 %*, Z = 1.19)   * Growth percentiles are based on CDC 2-20 Years data.   Body mass index is 25.32 kg/(m^2). @BMIFA @ Normalized weight-for-age data available only for age 7 to 20 years. Normalized stature-for-age data available only for age 7 to 20 years.    Constitutional: David [name] appears healthy, alert and interactive.  Eyes: There is no obvious arcus or proptosis. Moisture appears normal. Mouth: The oropharynx and tongue appear normal. Dentition appears to be normal for age. Oral moisture is normal. Neck: The neck appears to be visibly enlarged. No carotid bruits are noted. His strap muscles are quite large. The thyroid gland is larger at 23-24 grams in size. The consistency of the thyroid gland is relatively firm. The thyroid gland is not tender to palpation. Lungs: The lungs are clear to auscultation. Air movement is good. Heart: Heart rate and rhythm are regular. Heart sounds S1 and S2 are normal. I did not appreciate any pathologic cardiac murmurs. Abdomen: The abdomen is normal in size. Bowel sounds are  normal. There is no obvious hepatomegaly, splenomegaly, or other mass effect.  Arms: Muscle size and bulk are normal for age. Hands: There is no obvious tremor. Phalangeal and metacarpophalangeal joints are normal. Palmar muscles are normal for age. Palmar skin is normal. Palmar moisture is also normal. Legs: Muscles appear normal for age. No edema is present. Feet: Feet are normally formed. Dorsalis pedal pulses are 1+ bilaterally.  Neurologic: Strength is normal for age in both the upper and lower extremities. Muscle tone is normal. Sensation to touch is normal in the legs and soles of the feet.  LABS:  Results for orders placed or performed in visit on 01/23/15  POCT Glucose (CBG)  Result Value Ref Range   POC Glucose 173 (A) 70 - 99 mg/dl  POCT HgB R6E  Result Value Ref Range   Hemoglobin A1C 10.0        Labs 11/08/14: HA1c 9.9%  Labs 10/25/14: CBC normal, except for WBC 3.2 and neutrophil count 1.2; CMP normal except for glucose 162 and bilirubin 2.0; cholesterol 118, triglycerides  40, HDL 48, LDL 62; TSH 0.005, T4 14.2 (4.5-12.00)  Labs 06/26/14: Hemoglobin A1c 10.3% today, compared with 10.1% at last visit and with 13.4% at the visit prior. TSH < 0.008, free T4 2.21, free T3 5.1  Labs 02/07/14: TSH < 0.009, free T4 2.64, free T3 6.1  Labs 10/26/13: TSH 0.025, free T4 1.83, free T3 4.6  Labs 05/23/13: TSH 3.563, free T4 1.56, free T3 3.6, TPO antibody 958   Labs 02/21/13: TSH 8.951, free T4 1.62, free T3 3.3 (on Synthroid 375 mcg/day)  Labs 08/15/12: TSH 3.947, free T4 1.70, free T3 3.2; cholesterol 148, triglycerides 40, HDL 59, LDL 81; microalbumin/creatinine ratio 8.4; CMP normal except glucose 207.  Labs 04/11/12: TSH 20.851, free T4 0.98, free T3 2.2 on prior dose of Synthroid  Labs 12/07/11: TSH 5.658, free T4 1.19, free T3 2.9  Labs 05/13/11: TSH 0.648, Free T4 1.75, Free T3 3.4. Microalbumin/creatinine ratio was 3.0 (normal < 30). CMP was normal, except for BG of  255.  ASSESSMENT:  1. T1DM: [name] is struggling with motivation for his diabetes care but he does express that he wants to do better. He is not checking consistently at this point, but he has made progress by checking more often. He eats without bolusing for portions of the day. He was recently switched to Humalog and is unhappy about how he feels it is working, he feels he is using more insulin and having worse control.   2. Hypoglycemia: Rare, lowest was 58 in the last month.  3. Hypothyroid: He was hyperthyroid in April, so we decreased his dose of Synthroid dose. Thyroid labs were ordered and are pending results. Currently doing well on his schedule of Synthroid and reports no hypothyroid symptoms.  4. Thyroiditis: His thyroiditis has been fairly quiescent recently.  5. Goiter: The goiter is large. The process of waxing and waning of thyroid gland size and degrees of firmness are consistent with evolving Hashimoto's disease activity. 6. Peripheral neuropathy: Peripheral neuropathy is not evident today. If he can control his BGs better the neuropathy will not recur. 7. Hypertension: His BP is good today on low dose lisinopril therapy.  8. Adjustment reaction: [name] is adjusting to his transition into adult hood with more responsibility including his own diabetes care. He wants to do well and is willing to do what it takes to get better control of his diabetes.   PLAN: 1. Diagnostic: Labs as above.  2. Therapeutic: Continue synthroid and Lisinopril dose.  -Continue current basal rates at this time. Will work on bolusing properly. [name] developed a plan to help him regain control of his diabetes, we will speak weekly to check in.  - Start Dexcom CGM for tighter blood sugar control.  Plan -Check blood sugar minimum of 4 times per day  - bolus with at least three meals per day  - Speak with me weekly with blood sugars and insulin dosages.   3. Patient education: Discussed importance of  proper diabetes care including bolusing and checking his blood sugars often. 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 He understands that he is trying to save peoples lives, he has to take care of himself as well. He would like to start a Dexcom CGM and is also wanting to change to an Omnipod insulin pump.   4. Follow-up: Follow-up in 3 month. He will MyChart message me frequently, at least once a week with  his blood sugars.   Level of Service: This visit lasted in excess of 45 minutes. More than 50% of the visit was devoted to counseling.

## 2015-01-24 LAB — LIPID PANEL
CHOLESTEROL: 125 mg/dL (ref 125–170)
HDL: 51 mg/dL (ref >=40)
LDL Cholesterol: 65 mg/dL (ref <110)
Total CHOL/HDL Ratio: 2.5 Ratio (ref <=5.0)
Triglycerides: 47 mg/dL (ref <150)
VLDL: 9 mg/dL (ref <30)

## 2015-01-25 NOTE — Telephone Encounter (Signed)
Handled by provider.Emily M Hull °

## 2015-01-27 ENCOUNTER — Encounter: Payer: Self-pay | Admitting: Family

## 2015-01-30 ENCOUNTER — Other Ambulatory Visit: Payer: Self-pay | Admitting: "Endocrinology

## 2015-02-04 ENCOUNTER — Encounter: Payer: Self-pay | Admitting: Family

## 2015-02-11 ENCOUNTER — Encounter: Payer: Self-pay | Admitting: Family

## 2015-02-12 ENCOUNTER — Other Ambulatory Visit: Payer: Self-pay | Admitting: "Endocrinology

## 2015-02-18 ENCOUNTER — Encounter: Payer: Self-pay | Admitting: Family

## 2015-03-04 ENCOUNTER — Encounter: Payer: Self-pay | Admitting: Family

## 2015-03-11 ENCOUNTER — Encounter: Payer: Self-pay | Admitting: Family

## 2015-04-24 ENCOUNTER — Encounter: Payer: Self-pay | Admitting: *Deleted

## 2015-04-24 ENCOUNTER — Encounter: Payer: Self-pay | Admitting: Family

## 2015-04-24 ENCOUNTER — Ambulatory Visit (INDEPENDENT_AMBULATORY_CARE_PROVIDER_SITE_OTHER): Payer: Commercial Managed Care - HMO | Admitting: Family

## 2015-04-24 VITALS — BP 135/76 | HR 77 | Wt 194.0 lb

## 2015-04-24 DIAGNOSIS — E063 Autoimmune thyroiditis: Secondary | ICD-10-CM

## 2015-04-24 DIAGNOSIS — I10 Essential (primary) hypertension: Secondary | ICD-10-CM

## 2015-04-24 DIAGNOSIS — F54 Psychological and behavioral factors associated with disorders or diseases classified elsewhere: Secondary | ICD-10-CM | POA: Diagnosis not present

## 2015-04-24 DIAGNOSIS — IMO0001 Reserved for inherently not codable concepts without codable children: Secondary | ICD-10-CM

## 2015-04-24 DIAGNOSIS — E038 Other specified hypothyroidism: Secondary | ICD-10-CM

## 2015-04-24 DIAGNOSIS — E109 Type 1 diabetes mellitus without complications: Secondary | ICD-10-CM | POA: Diagnosis not present

## 2015-04-24 DIAGNOSIS — E1065 Type 1 diabetes mellitus with hyperglycemia: Principal | ICD-10-CM

## 2015-04-24 LAB — POCT GLYCOSYLATED HEMOGLOBIN (HGB A1C): Hemoglobin A1C: 9.3

## 2015-04-24 LAB — GLUCOSE, POCT (MANUAL RESULT ENTRY): POC GLUCOSE: 171 mg/dL — AB (ref 70–99)

## 2015-04-24 NOTE — Progress Notes (Signed)
Patient ID: David [name], male   DOB: 03/07/1996, 20 y.o.   MRN: [id number] HPI: David is a 20 y.o. Caucasian young man. He was unaccompanied.  1. David was admitted to [facility] on 12/18/05 for new onset T1DM, dehydration and ketonuria. He was initially treated with a multiple daily injection regimen of Lantus as a basal insulin and Novolog at mealtimes and at other times as needed. He was converted to an insulin pump in 2009. As he has grown to manhood he has required progressively larger doses of insulin. His compliance with checking BGs, giving boluses, and changing sites has varied significantly from time to time. Complications of his diabetes include hypoglycemia and mild peripheral neuropathy.   2. David was also diagnosed with hypothyroidism secondary to Hashimoto's Thyroiditis during this period and was treated with Synthroid. As he has grown older and as the Hashimoto's Disease has gradually but progressively destroyed thyrocytes, his doses of Synthroid have accordingly increased.   3. His last visit to [facility] was on 01/23/15. In the interim he has been healthy. David presents today with request that his providers sign his paperwork stating that he is taking good enough care of his diabetes that he should be allowed to work. I discussed with David that over the past few months, we have made very clear the standards that he will need to follow to have that paper work signed. 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 At today's appointment, David has increased the amount that he is checking and has better blood sugars overall. He also is about to be the father of twin infants, his wife is [redacted] weeks pregnant. He reports that he wants to do better, but loses motivation at times.   4. Pertinent Review of Systems: Constitutional: David feels "pretty good".  Eyes: Vision is good. His last diabetic eye exam was in late  July 2016. There were no signs of diabetic eye disease.  Neck: The patient has not had much anterior neck swelling recently.  Heart: Heart often beats faster and harder. Heart rate increases with exercise or other physical activity. The patient has no complaints of irregular heat beats, chest pain, or chest pressure. Stamina is good.  Gastrointestinal: Bowel movents seem normal. The patient has no complaints of excessive hunger, acid reflux, upset stomach, stomach aches or pains, diarrhea, or constipation. Legs: Muscle mass and strength seem normal. There are no complaints of numbness, tingling, burning, or pain. No edema is noted. Feet: There are no obvious foot problems. There are no complaints of numbness, tingling, burning, or pain. No edema is noted. Hypoglycemia: David has not had many low BGs. None were severe.   5. BG meter and pump printout: Checking 2.9 times per day. Avg BG 223. Range 38-429. He has no missed days of blood sugar checks. He is changing his site every three days.  Last visit: Checks 1-4 times per day, average 2.8 checks per day. Two days with no checks. He is bolusing 2.4 times per day. BG range 58 - >400Changes site every 3-4 days. Reports no low blood sugars and that he has been running higher the normal since switching to Humalog.   Last Visit: He checks 1-5 times per day, average of 2.4 test per day. He admits to not checking before meals frequently. He is bolusing 2.2 times per day. He changes his site every 3-4 days. He has 5 low blood  glucose recorded, only one is below 70. He is above target 64% of the time. His basal insulin is 68% of his total daily insulin. Average blood glucose is 241 +-114. Range is 65- >400   PAST MEDICAL, FAMILY, AND SOCIAL HISTORY: 1. School and family: He now works full-time for the [employer]. 2. Activities: David is more physically active at work now. He does not use alcohol or illicit drugs.  3. Primary  Care Pediatrician: none  REVIEW OF SYSTEMS: David does not have any other significant issues involving any of his other body systems.  PHYSICAL EXAM:  Blood pressure 135/76, pulse 77, weight 87.998 kg (194 lb).   Facility age limit for growth percentiles is 20 years. Wt Readings from Last 3 Encounters:  04/24/15 87.998 kg (194 lb)  01/23/15 85.73 kg (189 lb)  12/17/14 84.641 kg (186 lb 9.6 oz)   Ht Readings from Last 3 Encounters:  08/15/12 6' 0.44" (1.84 m) (86 %*, Z = 1.08)  12/09/11 6' 0.24" (1.835 m) (86 %*, Z = 1.06)  08/18/11 6' 0.52" (1.842 m) (88 %*, Z = 1.19)   * Growth percentiles are based on CDC 2-20 Years data.   Body mass index is 25.99 kg/(m^2). @ Facility age limit for growth percentiles is 20 years. Facility age limit for growth percentiles is 20 years.    Constitutional: David appears healthy, alert and interactive.  Eyes: There is no obvious arcus or proptosis. Moisture appears normal. Mouth: The oropharynx and tongue appear normal. Dentition appears to be normal for age. Oral moisture is normal. Neck: The neck appears to be visibly enlarged. No carotid bruits are noted. His strap muscles are quite large. The thyroid gland is larger at 23-24 grams in size. The consistency of the thyroid gland is relatively firm. The thyroid gland is not tender to palpation. Lungs: The lungs are clear to auscultation. Air movement is good. Heart: Heart rate and rhythm are regular. Heart sounds S1 and S2 are normal. I did not appreciate any pathologic cardiac murmurs. Abdomen: The abdomen is normal in size. Bowel sounds are normal. There is no obvious hepatomegaly, splenomegaly, or other mass effect.  Arms: Muscle size and bulk are normal for age. Hands: There is no obvious tremor. Phalangeal and metacarpophalangeal joints are normal. Palmar muscles are normal for age. Palmar skin is normal. Palmar moisture is also normal. Legs: Muscles appear normal for age. No edema  is present. Feet: Feet are normally formed. Dorsalis pedal pulses are 1+ bilaterally.  Neurologic: Strength is normal for age in both the upper and lower extremities. Muscle tone is normal. Sensation to touch is normal in the legs and soles of the feet.  LABS:  Results for orders placed or performed in visit on 04/24/15  POCT Glucose (CBG)  Result Value Ref Range   POC Glucose 171 (A) 70 - 99 mg/dl  POCT HgB Z6X  Result Value Ref Range   Hemoglobin A1C 9.3        Labs 11/08/14: HA1c 9.9%  Labs 10/25/14: CBC normal, except for WBC 3.2 and neutrophil count 1.2; CMP normal except for glucose 162 and bilirubin 2.0; cholesterol 118, triglycerides 40, HDL 48, LDL 62; TSH 0.005, T4 14.2 (4.5-12.00)  Labs 06/26/14: Hemoglobin A1c 10.3% today, compared with 10.1% at last visit and with 13.4% at the visit prior. TSH < 0.008, free T4 2.21, free T3 5.1  Labs 02/07/14: TSH < 0.009, free T4 2.64, free T3 6.1  Labs 10/26/13: TSH 0.025,  free T4 1.83, free T3 4.6  Labs 05/23/13: TSH 3.563, free T4 1.56, free T3 3.6, TPO antibody 958   Labs 02/21/13: TSH 8.951, free T4 1.62, free T3 3.3 (on Synthroid 375 mcg/day)  Labs 08/15/12: TSH 3.947, free T4 1.70, free T3 3.2; cholesterol 148, triglycerides 40, HDL 59, LDL 81; microalbumin/creatinine ratio 8.4; CMP normal except glucose 207.  Labs 04/11/12: TSH 20.851, free T4 0.98, free T3 2.2 on prior dose of Synthroid  Labs 12/07/11: TSH 5.658, free T4 1.19, free T3 2.9  Labs 05/13/11: TSH 0.648, Free T4 1.75, Free T3 3.4. Microalbumin/creatinine ratio was 3.0 (normal < 30). CMP was normal, except for BG of 255.  ASSESSMENT:  1. T1DM: Care has improved, he is checking more often and his blood sugars are more stable. David is also remember to bolus more often for his meals. His A1C had decreased from 10 to 9.3. He reports very few lows.    2. Hypoglycemia: Rare. No glucagon or emergency assistance needed.  3. Hypothyroid: He was hyperthyroid in April, so  we decreased his dose of Synthroid dose. Thyroid labs were ordered and are pending results. Currently doing well on his schedule of Synthroid and reports no hypothyroid symptoms.  4. Thyroiditis: His thyroiditis has been fairly quiescent recently.  5. Goiter: The goiter is large. The process of waxing and waning of thyroid gland size and degrees of firmness are consistent with evolving Hashimoto's disease activity. 6. Peripheral neuropathy: Peripheral neuropathy is not evident today. If he can control his BGs better the neuropathy will not recur. 7. Hypertension: His BP is good today on low dose lisinopril therapy.He is not taking consistently.  8. 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 He also wants to be able to take care of his kids when they are born.    PLAN: 1. Diagnostic: Labs as above.  2. Therapeutic: Continue synthroid and Lisinopril dose, needs to take Lisinopril daily.  - Will approve David to work at [employer] for 30 days period based on his improved care. However, I have set strict guidelines for him that he must comply with in order to get final approval. He will follow up in 30 days.   - Must check 4 times per day, with meals.   - Bolus with all meals and include blood sugars in bolus calculator.  3. Patient education: Discussed importance of proper diabetes care including bolusing and checking his blood sugars often. 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 He understands that he is trying to save peoples lives, he has to take care of himself as well. He would like to start a Dexcom CGM but is having trouble getting it approved. Will continue to work on getting approval.   4. Follow-up: Follow-up in 30 days.   Level of Service: This visit lasted in excess of 45 minutes. More than 50% of the visit was devoted to  counseling.

## 2015-04-24 NOTE — Patient Instructions (Signed)
-   Increase testing 4 times per day - Bolusing with all meals  - Continue bolusing with all meals.  - Will refer to adult endo.

## 2015-05-13 ENCOUNTER — Other Ambulatory Visit: Payer: Self-pay | Admitting: Family

## 2015-05-22 ENCOUNTER — Encounter: Payer: Self-pay | Admitting: Family

## 2015-05-22 ENCOUNTER — Ambulatory Visit (INDEPENDENT_AMBULATORY_CARE_PROVIDER_SITE_OTHER): Payer: Commercial Managed Care - HMO | Admitting: Family

## 2015-05-22 VITALS — BP 129/70 | HR 88 | Wt 197.6 lb

## 2015-05-22 DIAGNOSIS — E038 Other specified hypothyroidism: Secondary | ICD-10-CM

## 2015-05-22 DIAGNOSIS — E063 Autoimmune thyroiditis: Secondary | ICD-10-CM

## 2015-05-22 DIAGNOSIS — E109 Type 1 diabetes mellitus without complications: Secondary | ICD-10-CM | POA: Diagnosis not present

## 2015-05-22 DIAGNOSIS — I10 Essential (primary) hypertension: Secondary | ICD-10-CM

## 2015-05-22 DIAGNOSIS — E1065 Type 1 diabetes mellitus with hyperglycemia: Principal | ICD-10-CM

## 2015-05-22 DIAGNOSIS — E049 Nontoxic goiter, unspecified: Secondary | ICD-10-CM

## 2015-05-22 DIAGNOSIS — IMO0001 Reserved for inherently not codable concepts without codable children: Secondary | ICD-10-CM

## 2015-05-22 LAB — CBC WITH DIFFERENTIAL/PLATELET
BASOS ABS: 0 10*3/uL (ref 0.0–0.1)
Basophils Relative: 0 % (ref 0–1)
EOS PCT: 2 % (ref 0–5)
Eosinophils Absolute: 0.1 10*3/uL (ref 0.0–0.7)
HEMATOCRIT: 47.5 % (ref 39.0–52.0)
Hemoglobin: 16.7 g/dL (ref 13.0–17.0)
LYMPHS ABS: 1.2 10*3/uL (ref 0.7–4.0)
LYMPHS PCT: 18 % (ref 12–46)
MCH: 29.3 pg (ref 26.0–34.0)
MCHC: 35.2 g/dL (ref 30.0–36.0)
MCV: 83.5 fL (ref 78.0–100.0)
MPV: 9.6 fL (ref 8.6–12.4)
Monocytes Absolute: 0.6 10*3/uL (ref 0.1–1.0)
Monocytes Relative: 8 % (ref 3–12)
Neutro Abs: 5 10*3/uL (ref 1.7–7.7)
Neutrophils Relative %: 72 % (ref 43–77)
Platelets: 266 10*3/uL (ref 150–400)
RBC: 5.69 MIL/uL (ref 4.22–5.81)
RDW: 13.6 % (ref 11.5–15.5)
WBC: 6.9 10*3/uL (ref 4.0–10.5)

## 2015-05-22 LAB — COMPREHENSIVE METABOLIC PANEL
ALT: 12 U/L (ref 9–46)
AST: 13 U/L (ref 10–40)
Albumin: 4.2 g/dL (ref 3.6–5.1)
Alkaline Phosphatase: 70 U/L (ref 40–115)
BUN: 13 mg/dL (ref 7–25)
CALCIUM: 9.1 mg/dL (ref 8.6–10.3)
CO2: 28 mmol/L (ref 20–31)
CREATININE: 0.71 mg/dL (ref 0.60–1.35)
Chloride: 99 mmol/L (ref 98–110)
Glucose, Bld: 252 mg/dL — ABNORMAL HIGH (ref 70–99)
POTASSIUM: 4.5 mmol/L (ref 3.5–5.3)
Sodium: 136 mmol/L (ref 135–146)
TOTAL PROTEIN: 6.6 g/dL (ref 6.1–8.1)
Total Bilirubin: 2.5 mg/dL — ABNORMAL HIGH (ref 0.2–1.2)

## 2015-05-22 LAB — GLUCOSE, POCT (MANUAL RESULT ENTRY): POC Glucose: 171 mg/dl — AB (ref 70–99)

## 2015-05-22 LAB — TSH: TSH: 0.01 mIU/L — ABNORMAL LOW (ref 0.40–4.50)

## 2015-05-22 LAB — T3, FREE: T3 FREE: 3.4 pg/mL (ref 2.3–4.2)

## 2015-05-22 LAB — LIPID PANEL
CHOLESTEROL: 150 mg/dL (ref 125–200)
HDL: 51 mg/dL (ref >=40)
LDL CALC: 81 mg/dL (ref <130)
Total CHOL/HDL Ratio: 2.9 Ratio (ref <=5.0)
Triglycerides: 91 mg/dL (ref <150)
VLDL: 18 mg/dL (ref <30)

## 2015-05-22 LAB — T4, FREE: Free T4: 1.7 ng/dL (ref 0.8–1.8)

## 2015-05-22 NOTE — Progress Notes (Addendum)
Pediatric Endocrinology Diabetes Consultation Follow-up Visit  David [name] 04-Sep-1995 [id number]  Chief Complaint: Follow-up type 1 diabetes   David PicaUBIN,[name], MD   HPI: David  is a 20 y.o. male presenting for follow-up of type 1 diabetes.   1. 1. David was admitted to [facility] on 12/18/05 for new onset T1DM, dehydration and ketonuria. He was initially treated with a multiple daily injection regimen of Lantus as a basal insulin and Novolog at mealtimes and at other times as needed. He was converted to an insulin pump in 2009. As he has grown to manhood he has required progressively larger doses of insulin. His compliance with checking BGs, giving boluses, and changing sites has varied significantly from time to time. Complications of his diabetes include hypoglycemia and mild peripheral neuropathy.   2. Since last visit to [facility] on 04/2015, he has been well.  No ER visits or hospitalizations. David has been working hard to be more responsible with his diabetes care. He reports that he is working at [employer] and has been very vigilant of what his blood sugars are because he now realizes that he needs to care for himself and other people. He reports that his wife is doing well with her pregnancy of twins. He has been bolusing better with meals and states that he has noticed that he goes low more often in the mornings. He is also working out more now and has started taking protein supplements for muscle recovery.   Insulin regimen: Using a Medtronic insulin pump Hypoglycemia: Able to feel low blood sugars.  No glucagon needed recently.  Blood glucose download: Checking 3.6 times per day. Avg Bg 233. Bg Range 37-466. He reports the 37 was a misreading. He reports most lows are in the morning after breakfast and he denies severe lows and need for glucagon.  Med-alert ID: Not currently wearing. Injection sites: arms, legs and abdomen Annual labs due: Today  Ophthalmology due: 2017.  Discussed importance of this appointment at today's visit.     3. ROS: Greater than 10 systems reviewed with pertinent positives listed in HPI, otherwise neg. Constitutional: weight loss/gain, energy level Eyes: No changes in vision Ears/Nose/Mouth/Throat: No difficulty swallowing. Cardiovascular: No palpitations Respiratory: No increased work of breathing Gastrointestinal: No constipation or diarrhea. No abdominal pain Genitourinary: No nocturia, no polyuria Musculoskeletal: No joint pain Neurologic: Normal sensation, no tremor Endocrine: No polydipsia.  No hyperpigmentation Psychiatric: Normal affect  Past Medical History:  Past Medical History  Diagnosis Date  . Diabetes mellitus   . Hypoglycemia associated with diabetes (HCC)   . Goiter   . Hypothyroidism, acquired, autoimmune   . Gynecomastia   . Thyroiditis, autoimmune     Medications:  Outpatient Encounter Prescriptions as of 05/22/2015  Medication Sig  . glucagon (GLUCAGON EMERGENCY) 1 MG injection Inject 1mg  intramuscular as needed for severe Hypoglecemia. Dispense 4= 30 days  . insulin lispro (HUMALOG) 100 UNIT/ML injection Use up to 300 units in insulin pump  . levothyroxine (SYNTHROID, LEVOTHROID) 200 MCG tablet TALE 2 TABLETS BY MOUTH FIVE DAYS A WEEK AND 3 TABS FOR TWO DAYS A WEEKS  . lisinopril (PRINIVIL,ZESTRIL) 2.5 MG tablet TAKE 1 TABLET BY MOUTH EVERY DAY   No facility-administered encounter medications on file as of 05/22/2015.    Allergies: No Known Allergies  Surgical History: No past surgical history on file.  Family History:  Family History  Problem Relation Age of Onset  . Diabetes Mother   . Hypothyroidism Mother   .  Cancer Mother   . Hypertension Mother   . Cancer Brother   . Hyperthyroidism Paternal Grandfather    Social History: Lives with: his wife and her parents. He is about to be a father of twins.  Works for Warden/ranger at [employer].   Physical Exam:  Filed Vitals:    05/22/15 0855  BP: 129/70  Pulse: 88  Weight: 197 lb 9.6 oz (89.631 kg)   BP 129/70 mmHg  Pulse 88  Wt 197 lb 9.6 oz (89.631 kg) Body mass index: body mass index is 26.47 kg/(m^2). Facility age limit for growth percentiles is 20 years.  Ht Readings from Last 3 Encounters:  08/15/12 6' 0.44" (1.84 m) (86 %*, Z = 1.08)  12/09/11 6' 0.24" (1.835 m) (86 %*, Z = 1.06)  08/18/11 6' 0.52" (1.842 m) (88 %*, Z = 1.19)   * Growth percentiles are based on CDC 2-20 Years data.   Wt Readings from Last 3 Encounters:  05/22/15 197 lb 9.6 oz (89.631 kg)  04/24/15 194 lb (87.998 kg)  01/23/15 189 lb (85.73 kg)    Constitutional: David [name] appears healthy, alert and interactive.  Eyes: There is no obvious arcus or proptosis. Moisture appears normal. Mouth: The oropharynx and tongue appear normal. Dentition appears to be normal for age. Oral moisture is normal. Neck: The neck appears to be visibly enlarged. No carotid bruits are noted. His strap muscles are quite large. The thyroid gland is larger at 23-24 grams in size. The consistency of the thyroid gland is relatively firm. The thyroid gland is not tender to palpation. Lungs: The lungs are clear to auscultation. Air movement is good. Heart: Heart rate and rhythm are regular. Heart sounds S1 and S2 are normal. I did not appreciate any pathologic cardiac murmurs. Abdomen: The abdomen is normal in size. Bowel sounds are normal. There is no obvious hepatomegaly, splenomegaly, or other mass effect.  Arms: Muscle size and bulk are normal for age. Hands: There is no obvious tremor. Phalangeal and metacarpophalangeal joints are normal. Palmar muscles are normal for age. Palmar skin is normal. Palmar moisture is also normal. Legs: Muscles appear normal for age. No edema is present. Feet: Feet are normally formed. Dorsalis pedal pulses are 1+ bilaterally.  Neurologic: Strength is normal for age in both the upper and lower extremities. Muscle tone is  normal. Sensation to touch is normal in the legs and soles of the feet.  Labs: Last hemoglobin A1c:  Lab Results  Component Value Date   HGBA1C 9.3 04/24/2015   Results for orders placed or performed in visit on 05/22/15  POCT Glucose (CBG)  Result Value Ref Range   POC Glucose 171 (A) 70 - 99 mg/dl    Assessment/Plan: David [name] is a 20 y.o. male with type 1 diabetes in sub-optimal control. David [name] continues to make strides with his diabetes care. He is checking more consistently and giving boluses for his food correctly. He appears to understand that he has to take care of his diabetes in order to continue working and providing for his family.   1. DM w/o complication type I, uncontrolled (HCC) - Continue checking Bg at least 4 times per day - Bolus with all meals and to correct for high blood sugars - Always keep glucose with you - Contact [facility] about "no tubes attached" program.  - Annual labs today.  - POCT Glucose (CBG) - Decrease 5am carb ratio from 1-6 to 1-9   2. Goiter He takes of synthroid  5 days a week and 2 days per week. Labs today.  - T4, free - T3, free - TSH - Comprehensive Metabolic Panel (CMET)  4. Essential hypertension, benign Continue taking lisinopril as prescribed.   5. Hypothyroidism, acquired, autoimmune Thyroid labs today.     Follow-up:   Return in about 2 months (around 07/22/2015).   Medical decision-making:  > 25  minutes spent, more than 50% of appointment was spent discussing diagnosis and management of symptoms  Gretchen Short, FNP-C

## 2015-05-22 NOTE — Patient Instructions (Addendum)
-   Doing a great job with blood sugar checks and bolusing with food. You have made some big improvements.  - Increase carb ratio at 5am from 1-6 to 1-9  - yearly labs today

## 2015-05-23 LAB — MICROALBUMIN / CREATININE URINE RATIO
CREATININE, URINE: 213 mg/dL (ref 20–370)
MICROALB UR: 0.7 mg/dL
MICROALB/CREAT RATIO: 3 ug/mg{creat} (ref <30)

## 2015-05-24 ENCOUNTER — Ambulatory Visit (INDEPENDENT_AMBULATORY_CARE_PROVIDER_SITE_OTHER): Payer: Commercial Managed Care - HMO | Admitting: Physician Assistant

## 2015-05-24 VITALS — BP 128/80 | HR 91 | Temp 98.4°F | Resp 16 | Ht 73.0 in | Wt 201.0 lb

## 2015-05-24 DIAGNOSIS — J069 Acute upper respiratory infection, unspecified: Secondary | ICD-10-CM | POA: Diagnosis not present

## 2015-05-24 DIAGNOSIS — Z20828 Contact with and (suspected) exposure to other viral communicable diseases: Secondary | ICD-10-CM

## 2015-05-24 MED ORDER — HYDROCODONE-HOMATROPINE 5-1.5 MG/5ML PO SYRP: 5.0000 mL | ORAL_SOLUTION | Freq: Three times a day (TID) | ORAL | Status: DC | PRN

## 2015-05-24 MED ORDER — OSELTAMIVIR PHOSPHATE 6 MG/ML PO SUSR: 75.0000 mg | Freq: Two times a day (BID) | ORAL | Status: AC

## 2015-05-24 NOTE — Patient Instructions (Addendum)
Drink plenty of water (64 oz/day) and get plenty of rest. If you have been prescribed a cough syrup, do not drive or operate heavy machinery while using this medication. Take robitussin during the day. tamiflu twice a day for 5 days. Ibuprofen/tylenol as needed. If your symptoms are not improving in 1 week, return to clinic.      IF you received an x-ray today, you will receive an invoice from [facility]. Please contact [facility] at [phone] with questions or concerns regarding your invoice.   IF you received labwork today, you will receive an invoice from [facility]. Please contact [facility] at [phone] with questions or concerns regarding your invoice.   Our billing staff will not be able to assist you with questions regarding bills from these companies.  You will be contacted with the lab results as soon as they are available. The fastest way to get your results is to activate your My Chart account. Instructions are located on the last page of this paperwork. If you have not heard from us regarding the results in 2 weeks, please contact this office.

## 2015-05-24 NOTE — Progress Notes (Signed)
[facility] [address], [place] [address] [phone]  Date:  05/24/2015   Name:  David [name]   DOB:  1995-08-25   MRN:  [id number]  PCP:  [name],[name], MD    Chief Complaint: Cough; Nasal Congestion; chest congestion; Emesis; and Nausea   History of Present Illness:  This is a 20 y.o. male with PMH T1DM, hypothyroidism who is presenting with 2 days of cough and nasal congestion. States this morning had nausea and 2 episodes of emesis. States he is having some chest pressure with coughing. Cough is a mix of dry and productive. Denies sob or wheezing. States he felt feverish yesterday but did not check temp. Hasn't been taking anything for symptoms.  Denies hx of asthma or allergies. Not a smoker. Works as IT sales professionalfirefighter. Works night shift. States he was exposed one person at work with the flu and one person with pna.  Review of Systems:  Review of Systems See HPI  Patient Active Problem List   Diagnosis Date Noted  . Maladaptive health behaviors affecting medical condition 04/24/2015  . Adjustment reaction of adult life 01/23/2015  . Adjustment reaction to medical therapy 11/08/2014  . Poor sleep pattern 02/21/2013  . Essential hypertension, benign 08/16/2012  . Diabetic peripheral neuropathy (HCC) 08/16/2012  . Hypoglycemia associated with diabetes (HCC) 07/18/2010  . Goiter   . Hypothyroidism, acquired, autoimmune   . Thyroiditis, autoimmune   . Type I (juvenile type) diabetes mellitus without mention of complication, uncontrolled 06/17/2010  . CHRONIC RHINITIS 06/11/2008  . DYSPNEA 06/11/2008    Prior to Admission medications   Medication Sig Start Date End Date Taking? Authorizing Provider  glucagon (GLUCAGON EMERGENCY) 1 MG injection Inject 1mg  intramuscular as needed for severe Hypoglecemia. Dispense 4= 30 days 08/22/12  Yes David StallMichael J Brennan, MD  insulin lispro (HUMALOG) 100 UNIT/ML injection Use up to 300 units in insulin pump  12/24/14  Yes Gretchen ShortSpenser Beasley, NP  levothyroxine (SYNTHROID, LEVOTHROID) 200 MCG tablet TALE 2 TABLETS BY MOUTH FIVE DAYS A WEEK AND 3 TABS FOR TWO DAYS A WEEKS 02/12/15  Yes Gretchen ShortSpenser Beasley, NP  lisinopril (PRINIVIL,ZESTRIL) 2.5 MG tablet TAKE 1 TABLET BY MOUTH EVERY DAY 05/13/15  Yes Gretchen ShortSpenser Beasley, NP    No Known Allergies  History reviewed. No pertinent past surgical history.  Social History  Substance Use Topics  . Smoking status: Never Smoker   . Smokeless tobacco: Never Used  . Alcohol Use: No    Family History  Problem Relation Age of Onset  . Diabetes Mother   . Hypothyroidism Mother   . Cancer Mother   . Hypertension Mother   . Cancer Brother   . Hyperthyroidism Paternal Grandfather     Medication list has been reviewed and updated.  Physical Examination:  Physical Exam  Constitutional: He is oriented to person, place, and time. He appears well-developed and well-nourished. No distress.  HENT:  Head: Normocephalic and atraumatic.  Right Ear: Hearing, tympanic membrane, external ear and ear canal normal.  Left Ear: Hearing, tympanic membrane, external ear and ear canal normal.  Nose: Mucosal edema and rhinorrhea present. Right sinus exhibits no maxillary sinus tenderness and no frontal sinus tenderness. Left sinus exhibits no maxillary sinus tenderness and no frontal sinus tenderness.  Mouth/Throat: Uvula is midline, oropharynx is clear and moist and mucous membranes are normal.  Eyes: Conjunctivae and lids are normal. Right eye exhibits no discharge. Left eye exhibits no discharge. No scleral icterus.  Cardiovascular: Normal  rate, regular rhythm, normal heart sounds, intact distal pulses and normal pulses.   No murmur heard. Pulmonary/Chest: Effort normal and breath sounds normal. No respiratory distress. He has no wheezes. He has no rhonchi. He has no rales.  Musculoskeletal: Normal range of motion.  Lymphadenopathy:       Head (right side): No submental, no  submandibular and no tonsillar adenopathy present.       Head (left side): No submental, no submandibular and no tonsillar adenopathy present.    He has no cervical adenopathy.  Neurological: He is alert and oriented to person, place, and time.  Skin: Skin is warm, dry and intact. No lesion and no rash noted.  Psychiatric: He has a normal mood and affect. His speech is normal and behavior is normal. Thought content normal.   BP 128/80 mmHg  Pulse 91  Temp(Src) 98.4 F (36.9 C) (Oral)  Resp 16  Ht  (1.854 m)  Wt 201 lb (91.173 kg)  BMI 26.52 kg/m2  SpO2 97%  Assessment and Plan:  1. Viral URI 2. Exposure to the flu Vitals and exam normal. Likely viral uri, focus is on supportive care. Since exposed to flu and has T1DM, will cover for flu with tamiflu. Return in 1 week if symptoms do not improve or at any time if symptoms worsen.  - HYDROcodone-homatropine (HYCODAN) 5-1.5 MG/5ML syrup; Take 5 mLs by mouth every 8 (eight) hours as needed for cough.  Dispense: 120 mL; Refill: 0 - oseltamivir (TAMIFLU) 6 MG/ML SUSR suspension; Take 12.5 mLs (75 mg total) by mouth 2 (two) times daily.  Dispense: 125 mL; Refill: 0   Nicole V. Dyke Brackett, MHS [facility]  05/24/2015

## 2015-05-28 ENCOUNTER — Other Ambulatory Visit: Payer: Self-pay | Admitting: Family

## 2015-05-28 ENCOUNTER — Telehealth: Payer: Self-pay | Admitting: Family

## 2015-05-28 DIAGNOSIS — E039 Hypothyroidism, unspecified: Secondary | ICD-10-CM

## 2015-05-28 NOTE — Telephone Encounter (Signed)
Spoke to [name] on the phone, He states he has been taking 300mcg per day of synthroid. We will decrease to 200mcg daily. Recheck labs in 2 months.

## 2015-05-28 NOTE — Telephone Encounter (Signed)
Called and left message that form was filled out for work. Would also like to discuss labs, we need to decrease synthroid. Will decrease to 400mg  daily and have TFT's redrawn in 8 weeks.

## 2015-05-28 NOTE — Telephone Encounter (Signed)
Letter was written for patient's job. Patient aware this is up front for pick up. A copy was made and sent to batch scan.

## 2015-07-23 ENCOUNTER — Ambulatory Visit (INDEPENDENT_AMBULATORY_CARE_PROVIDER_SITE_OTHER): Payer: Commercial Managed Care - HMO | Admitting: Family

## 2015-07-23 ENCOUNTER — Encounter: Payer: Self-pay | Admitting: Family

## 2015-07-23 VITALS — BP 115/65 | HR 73 | Wt 190.8 lb

## 2015-07-23 DIAGNOSIS — E039 Hypothyroidism, unspecified: Secondary | ICD-10-CM | POA: Diagnosis not present

## 2015-07-23 DIAGNOSIS — E119 Type 2 diabetes mellitus without complications: Secondary | ICD-10-CM | POA: Insufficient documentation

## 2015-07-23 DIAGNOSIS — I10 Essential (primary) hypertension: Secondary | ICD-10-CM

## 2015-07-23 DIAGNOSIS — F54 Psychological and behavioral factors associated with disorders or diseases classified elsewhere: Secondary | ICD-10-CM | POA: Diagnosis not present

## 2015-07-23 DIAGNOSIS — E109 Type 1 diabetes mellitus without complications: Secondary | ICD-10-CM | POA: Diagnosis not present

## 2015-07-23 DIAGNOSIS — IMO0001 Reserved for inherently not codable concepts without codable children: Secondary | ICD-10-CM

## 2015-07-23 DIAGNOSIS — E10649 Type 1 diabetes mellitus with hypoglycemia without coma: Secondary | ICD-10-CM | POA: Insufficient documentation

## 2015-07-23 DIAGNOSIS — E1065 Type 1 diabetes mellitus with hyperglycemia: Principal | ICD-10-CM

## 2015-07-23 LAB — POCT GLYCOSYLATED HEMOGLOBIN (HGB A1C): Hemoglobin A1C: 10.3

## 2015-07-23 LAB — GLUCOSE, POCT (MANUAL RESULT ENTRY): POC Glucose: 181 mg/dl — AB (ref 70–99)

## 2015-07-23 NOTE — Patient Instructions (Signed)
-   TFT's today. I will call you with results and adjust synthroid as needed.  - Schedule eye appointment  - Increase blood sugar checks to 4 times per day   - Enter all blood sugars into bolus wizard on pump  - bolus with every meal and to correct for high blood sugars.  - Change pump site at least every 3 days.  - Follow up in one month for recheck.

## 2015-07-23 NOTE — Progress Notes (Signed)
Pediatric Endocrinology Diabetes Consultation Follow-up Visit  David [name] C Rabago Dec 16, 1995 [id number]  Chief Complaint: Follow-up type 1 diabetes   Jefferey Pica, MD   HPI: David [name]  is a 20 y.o. male presenting for follow-up of type 1 diabetes.   1. 1. David [name] was admitted to [facility] on 12/18/05 for new onset T1DM, dehydration and ketonuria. He was initially treated with a multiple daily injection regimen of Lantus as a basal insulin and Novolog at mealtimes and at other times as needed. He was converted to an insulin pump in 2009. As he has grown to manhood he has required progressively larger doses of insulin. His compliance with checking BGs, giving boluses, and changing sites has varied significantly from time to time. Complications of his diabetes include hypoglycemia and mild peripheral neuropathy.   2. Since last visit to [facility] on 3/212017, he has been well.  No ER visits or hospitalizations. David [name] is working at [employer] 3 days per week. His wife is due with twins on August 27th, however, the OBGYN believes she will probably have them between 32-34 weeks. David [name] states that he has been feeling tired lately. He has not been taking as close care of his diabetes as he was in his previous two visit. He is not checking as often but admits his biggest problem is that he has not been bolusing appropriately when he eats and for his blood sugars. He is changing his pump site ever 4-5 days.   Insulin regimen: Using a Medtronic insulin pump Hypoglycemia: Unable to feel low blood sugars.  No glucagon needed recently.  Blood glucose download: Checking 2.4 times per day. Avg Bg 235. Bg Range 45-502.  Last visit: Checking 3.6 times per day. Avg Bg 233. Bg Range 37-466. He reports the 37 was a misreading. He reports most lows are in the morning after breakfast and he denies severe lows and need for glucagon.  Med-alert ID: Not currently wearing. Injection sites: arms, legs and abdomen Annual  labs due: Today  Ophthalmology due: 2017. Discussed importance of this appointment at today's visit. 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     3. ROS: Greater than 10 systems reviewed with pertinent positives listed in HPI, otherwise neg. Constitutional: Weight is stable. Feels tired today.  Eyes: No changes in vision Ears/Nose/Mouth/Throat: No difficulty swallowing. Cardiovascular: No palpitations Respiratory: No increased work of breathing Gastrointestinal: No constipation or diarrhea. No abdominal pain Genitourinary: No nocturia, no polyuria Musculoskeletal: No joint pain Neurologic: Normal sensation, no tremor Endocrine: No polydipsia.  No hyperpigmentation Psychiatric: Normal affect  Past Medical History:  Past Medical History  Diagnosis Date  . Diabetes mellitus   . Hypoglycemia associated with diabetes (HCC)   . Goiter   . Hypothyroidism, acquired, autoimmune   . Gynecomastia   . Thyroiditis, autoimmune     Medications:  Outpatient Encounter Prescriptions as of 07/23/2015  Medication Sig  . glucagon (GLUCAGON EMERGENCY) 1 MG injection Inject  intramuscular as needed for severe Hypoglecemia. Dispense 4= 30 days  . insulin lispro (HUMALOG) 100 UNIT/ML injection Use up to 300 units in insulin pump  . levothyroxine (SYNTHROID, LEVOTHROID) 200 MCG tablet TALE 2 TABLETS BY MOUTH FIVE DAYS A WEEK AND 3 TABS FOR TWO DAYS A WEEKS  . lisinopril (PRINIVIL,ZESTRIL) 2.5 MG tablet TAKE 1 TABLET BY MOUTH EVERY DAY  . HYDROcodone-homatropine (HYCODAN) 5-1.5 MG/5ML syrup Take 5 mLs by mouth every 8 (eight) hours as needed for cough. (Patient not taking: Reported on 07/23/2015)  No facility-administered encounter medications on file as of 07/23/2015.    Allergies: No Known Allergies  Surgical History: No past surgical history on file.  Family History:  Family History  Problem Relation Age of Onset  . Diabetes Mother   . Hypothyroidism Mother   . Cancer  Mother   . Hypertension Mother   . Cancer Brother   . Hyperthyroidism Paternal Grandfather    Social History: Lives with: his wife and her parents. He is about to be a father of twins.  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   Physical Exam:  Filed Vitals:   07/23/15 0900  BP: 115/65  Pulse: 73  Weight: 86.546 kg (190 lb 12.8 oz)   BP 115/65 mmHg  Pulse 73  Wt 86.546 kg (190 lb 12.8 oz) Body mass index: body mass index is 25.18 kg/(m^2). Facility age limit for growth percentiles is 20 years.  Ht Readings from Last 3 Encounters:  05/24/15 6\' 1"  (1.854 m)  08/15/12 6' 0.44" (1.84 m) (86 %*, Z = 1.08)  12/09/11 6' 0.24" (1.835 m) (86 %*, Z = 1.06)   * Growth percentiles are based on CDC 2-20 Years data.   Wt Readings from Last 3 Encounters:  07/23/15 86.546 kg (190 lb 12.8 oz)  05/24/15 91.173 kg (201 lb)  05/22/15 89.631 kg (197 lb 9.6 oz)    Constitutional: [name] appears healthy, alert and interactive.  Eyes: There is no obvious arcus or proptosis. Moisture appears normal. Mouth: The oropharynx and tongue appear normal. Dentition appears to be normal for age. Oral moisture is normal. Neck: The neck appears to be visibly enlarged. No carotid bruits are noted. His strap muscles are quite large. The thyroid gland is larger at 23-24 grams in size. The consistency of the thyroid gland is relatively firm. The thyroid gland is not tender to palpation. Lungs: The lungs are clear to auscultation. Air movement is good. Heart: Heart rate and rhythm are regular. Heart sounds S1 and S2 are normal. I did not appreciate any pathologic cardiac murmurs. Abdomen: The abdomen is normal in size. Bowel sounds are normal. There is no obvious hepatomegaly, splenomegaly, or other mass effect.  Arms: Muscle size and bulk are normal for age. Hands: There is no obvious tremor. Phalangeal and metacarpophalangeal joints are normal. Palmar muscles are normal for age. Palmar skin is normal.  Palmar moisture is also normal. Legs: Muscles appear normal for age. No edema is present. Feet: Feet are normally formed. Dorsalis pedal pulses are 1+ bilaterally.  Neurologic: Strength is normal for age in both the upper and lower extremities. Muscle tone is normal. Sensation to touch is normal in the legs and soles of the feet.  Labs: Last hemoglobin A1c:  Lab Results  Component Value Date   HGBA1C 10.3 07/23/2015   Results for orders placed or performed in visit on 07/23/15  POCT Glucose (CBG)  Result Value Ref Range   POC Glucose 181 (A) 70 - 99 mg/dl  POCT HgB [id number]  Result Value Ref Range   Hemoglobin A1C 10.3     Assessment/Plan: [name] is a 20 y.o. male with type 1 diabetes in sub-optimal control. [name] has struggled with his diabetes care since his last visit. He has not been bolusing or checking his blood sugar enough. He reports that he has been feeling more tired lately. He would really like a CGM if he can get insurance to approve it.   1. DM w/o complication type I, uncontrolled (HCC) -  Continue checking Bg at least 4 times per day - Bolus with all meals and to correct for high blood sugars - Always keep glucose with you - Annual labs today.  - POCT Glucose (CBG) - Filled out form for Dexcom CGM.    2. Goiter - Decreased Synthroid at last visit to 200mg  daily  - TFT's today     4. Essential hypertension, benign Continue taking lisinopril as prescribed.   5. Hypothyroidism, acquired, autoimmune Thyroid labs today.     Follow-up:  1 month   Medical decision-making:  > 40  minutes spent, more than 50% of appointment was spent discussing diagnosis and management of symptoms  Gretchen Short, FNP-C

## 2015-07-24 ENCOUNTER — Ambulatory Visit: Payer: Self-pay | Admitting: Family

## 2015-07-24 LAB — T3, FREE: T3, Free: 3 pg/mL (ref 2.3–4.2)

## 2015-07-24 LAB — T4, FREE: FREE T4: 1.3 ng/dL (ref 0.8–1.8)

## 2015-07-24 LAB — TSH: TSH: 0.63 m[IU]/L (ref 0.40–4.50)

## 2015-08-08 ENCOUNTER — Telehealth: Payer: Self-pay | Admitting: Family

## 2015-08-08 ENCOUNTER — Encounter: Payer: Self-pay | Admitting: *Deleted

## 2015-08-08 ENCOUNTER — Ambulatory Visit (INDEPENDENT_AMBULATORY_CARE_PROVIDER_SITE_OTHER): Payer: Commercial Managed Care - HMO | Admitting: *Deleted

## 2015-08-08 VITALS — BP 122/73 | HR 68 | Wt 194.0 lb

## 2015-08-08 DIAGNOSIS — E1065 Type 1 diabetes mellitus with hyperglycemia: Principal | ICD-10-CM

## 2015-08-08 DIAGNOSIS — E109 Type 1 diabetes mellitus without complications: Secondary | ICD-10-CM | POA: Diagnosis not present

## 2015-08-08 DIAGNOSIS — IMO0001 Reserved for inherently not codable concepts without codable children: Secondary | ICD-10-CM

## 2015-08-08 LAB — GLUCOSE, POCT (MANUAL RESULT ENTRY): POC Glucose: 194 mg/dl — AB (ref 70–99)

## 2015-08-08 NOTE — Progress Notes (Signed)
Insulin pump settings transferred  David [name] was here to transfer pump settings from his old Medtronic's insulin pump to his new Omni Pod insulin pump. He has been on his Medtronic's pump for since 2009. He works with the [employer] and says that the tubing on the pump reaps off very frequently, he is ready to try a tubeless pump. He had transferred his insulin pump settings to his new pump, we reviewed them to confirm are correct, which are as follows:  Insulin pump settings: Basal rates Time  U/Hr 12a-4:30a 1.15 4:30a-8a 1.80 8a-2p  1.30 2p-8p  1.50 8p-12a  1.35 Total Basal 33.65 Units   BG Target Time  Target 12a-12a 120  IC Ratio Time  Ratio 12a-5a  12 5a-11a  9 11a-12a 8  Insulin sensitivity / Correction Factor Time  Correction Factor 12a-5a   30 5a-11a   25 11a-12a  30  Active insulin Time 3 hours  Maximum Basal Rate 3.0 U/ Hr Maximum Bolus 20.0 units Extended Bolus % Temporary Basal  % BG Sound   Off BG goal Limits 80-120 Suggested Bolus Calc On Min BG for Bolus calc 70 mg/dL Reverse Correction On Bolus Increments 0.05 units Low Vol. Reservoir 25 units [employer] Expiration alert 4 hours  David [name] ready to start new Pod. Filled pod with 200 units of insulin. Let pod do auto prime. Chose upper left thigh for pod placement. Cleaned skin with alcohol Applied Skin Tac adhesive. Applied pod, and verified cannula insertion through pod window.  Patient tolerated very well the procedure.   Assessment: Patient participated on hands on training, had programmed PDM before he arrived.  Verbalized understanding information provided and given.  Plan: Gave Patient Resource book from [place] and advised to refer to it if any questions. Continue to check BG's as directed by provider.  Call our office if any questions or concerns regarding your pump or diabetes.

## 2015-08-08 NOTE — Telephone Encounter (Signed)
Called and discussed lab results. Thyroid values are normal on his new dose of 200mg  of synthroid. Will retest in 3 months. [name] in agreement, States he is not feeling as fatigued.

## 2015-08-21 ENCOUNTER — Other Ambulatory Visit: Payer: Self-pay | Admitting: *Deleted

## 2015-08-21 DIAGNOSIS — E1065 Type 1 diabetes mellitus with hyperglycemia: Principal | ICD-10-CM

## 2015-08-21 DIAGNOSIS — IMO0001 Reserved for inherently not codable concepts without codable children: Secondary | ICD-10-CM

## 2015-08-21 MED ORDER — GLUCOSE BLOOD VI STRP: ORAL_STRIP | Status: DC

## 2015-08-28 ENCOUNTER — Ambulatory Visit (INDEPENDENT_AMBULATORY_CARE_PROVIDER_SITE_OTHER): Payer: Commercial Managed Care - HMO | Admitting: Family

## 2015-08-28 ENCOUNTER — Encounter: Payer: Self-pay | Admitting: Family

## 2015-08-28 VITALS — BP 118/71 | HR 67 | Wt 203.0 lb

## 2015-08-28 DIAGNOSIS — F54 Psychological and behavioral factors associated with disorders or diseases classified elsewhere: Secondary | ICD-10-CM

## 2015-08-28 DIAGNOSIS — E038 Other specified hypothyroidism: Secondary | ICD-10-CM | POA: Diagnosis not present

## 2015-08-28 DIAGNOSIS — Z4681 Encounter for fitting and adjustment of insulin pump: Secondary | ICD-10-CM

## 2015-08-28 DIAGNOSIS — I10 Essential (primary) hypertension: Secondary | ICD-10-CM | POA: Diagnosis not present

## 2015-08-28 DIAGNOSIS — E063 Autoimmune thyroiditis: Secondary | ICD-10-CM

## 2015-08-28 NOTE — Patient Instructions (Addendum)
-   basal Changes   - 430am: 1.8--> 1.70   ( If you run high in the mornings, increase back to 1.75.) - Bolus before meals.  - Check blood sugar at least 4 x per day  - Change pod every 3 days. . - Keep glucose with you.    - Follow up in 2 months.

## 2015-08-28 NOTE — Progress Notes (Signed)
Pediatric Endocrinology Diabetes Consultation Follow-up Visit  David [name] C Kopko Nov 29, 1995 [id number]  Chief Complaint: Follow-up type 1 diabetes   Jefferey Pica, MD   HPI: David [name]  is a 20 y.o. male presenting for follow-up of type 1 diabetes.   1. 1. David [name] was admitted to [facility] on 12/18/05 for new onset T1DM, dehydration and ketonuria. He was initially treated with a multiple daily injection regimen of Lantus as a basal insulin and Novolog at mealtimes and at other times as needed. He was converted to an insulin pump in 2009. As he has grown to manhood he has required progressively larger doses of insulin. His compliance with checking BGs, giving boluses, and changing sites has varied significantly from time to time. Complications of his diabetes include hypoglycemia and mild peripheral neuropathy.   2. Since last visit to [facility] on 07/23/2015, he has been well.  No ER visits or hospitalizations. David [name] is very happy now that he has started on the Omnipod insulin pump. He feels like it has helped renew his energy in dealing with diabetes and he also feels like his control has been better. 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 He is motivated to do better with his diabetes care because of his growing family and responsibilities. He reports that he has also started working out 6 days a week and is no longer going out to eat, he is cooking his meals at home. He is feeling much better overall.   Insulin regimen: Using an Omnipod Insulin pump.  - Basal   - 12am: 1.15  - 430am: 1.80  - 8am: 1.30  - 2pm: 1.50  - 8pm: 1.35 Carb Ratio   - 12am: 12  - 5am: 9  ISF  - 12am: 30  - 5am: 25  - 11am: 30 Hypoglycemia: able to feel low blood sugars.  No glucagon needed recently.  Blood glucose download: Checking bg 3.1 times per day. Avg Bg 186. Bg Range 44-456. Blood sugars have greatly improved since switching to omnipod.   Last visit: Checking 2.4  times per day. Avg Bg 235. Bg Range 45-502.  Med-alert ID: Not currently wearing. Injection sites: arms, legs and abdomen Annual labs due: Today  Ophthalmology due: 2017. Discussed importance of this appointment at today's visit. 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     3. ROS: Greater than 10 systems reviewed with pertinent positives listed in HPI, otherwise neg. Constitutional: Weight is stable. Feels energized .  Eyes: No changes in vision Ears/Nose/Mouth/Throat: No difficulty swallowing. Cardiovascular: No palpitations Respiratory: No increased work of breathing Gastrointestinal: No constipation or diarrhea. No abdominal pain Genitourinary: No nocturia, no polyuria Musculoskeletal: No joint pain Neurologic: Normal sensation, no tremor Endocrine: No polydipsia.  No hyperpigmentation Psychiatric: Normal affect  Past Medical History:  Past Medical History  Diagnosis Date  . Diabetes mellitus   . Hypoglycemia associated with diabetes (HCC)   . Goiter   . Hypothyroidism, acquired, autoimmune   . Gynecomastia   . Thyroiditis, autoimmune     Medications:  Outpatient Encounter Prescriptions as of 08/28/2015  Medication Sig  . glucose blood (FREESTYLE LITE) test strip Check Bg 10x day  . insulin lispro (HUMALOG) 100 UNIT/ML injection Use up to 300 units in insulin pump  . levothyroxine (SYNTHROID, LEVOTHROID) 200 MCG tablet TALE 2 TABLETS BY MOUTH FIVE DAYS A WEEK AND 3 TABS FOR TWO DAYS A WEEKS  . lisinopril (PRINIVIL,ZESTRIL) 2.5  MG tablet TAKE 1 TABLET BY MOUTH EVERY DAY  . glucagon (GLUCAGON EMERGENCY) 1 MG injection Inject 1mg  intramuscular as needed for severe Hypoglecemia. Dispense 4= 30 days  . HYDROcodone-homatropine (HYCODAN) 5-1.5 MG/5ML syrup Take 5 mLs by mouth every 8 (eight) hours as needed for cough. (Patient not taking: Reported on 07/23/2015)   No facility-administered encounter medications on file as of 08/28/2015.    Allergies: No Known  Allergies  Surgical History: No past surgical history on file.  Family History:  Family History  Problem Relation Age of Onset  . Diabetes Mother   . Hypothyroidism Mother   . Cancer Mother   . Hypertension Mother   . Cancer Brother   . Hyperthyroidism Paternal Grandfather    Social History: Lives with: his wife and her parents. He is about to be a father of twins.  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   Physical Exam:  Filed Vitals:   08/28/15 0905  BP: 118/71  Pulse: 67  Weight: 92.08 kg (203 lb)   BP 118/71 mmHg  Pulse 67  Wt 92.08 kg (203 lb) Body mass index: body mass index is 26.79 kg/(m^2). Facility age limit for growth percentiles is 20 years.  Ht Readings from Last 3 Encounters:  05/24/15 6\' 1"  (1.854 m)  08/15/12 6' 0.44" (1.84 m) (86 %*, Z = 1.08)  12/09/11 6' 0.24" (1.835 m) (86 %*, Z = 1.06)   * Growth percentiles are based on CDC 2-20 Years data.   Wt Readings from Last 3 Encounters:  08/28/15 92.08 kg (203 lb)  08/08/15 87.998 kg (194 lb)  07/23/15 86.546 kg (190 lb 12.8 oz)    Constitutional: [name] appears healthy, alert and interactive.  Eyes: There is no obvious arcus or proptosis. Moisture appears normal. Mouth: The oropharynx and tongue appear normal. Dentition appears to be normal for age. Oral moisture is normal. Neck: The neck appears to be visibly enlarged. No carotid bruits are noted. His strap muscles are quite large. The consistency of the thyroid gland is relatively firm. The thyroid gland is not tender to palpation. Lungs: The lungs are clear to auscultation. Air movement is good. Heart: Heart rate and rhythm are regular. Heart sounds S1 and S2 are normal. I did not appreciate any pathologic cardiac murmurs. Abdomen: The abdomen is normal in size. Bowel sounds are normal. There is no obvious hepatomegaly, splenomegaly, or other mass effect.  Arms: Muscle size and bulk are normal for age. Hands: There is no obvious tremor.  Phalangeal and metacarpophalangeal joints are normal. Palmar muscles are normal for age. Palmar skin is normal. Palmar moisture is also normal. Legs: Muscles appear normal for age. No edema is present. Feet: Feet are normally formed. Dorsalis pedal pulses are 1+ bilaterally.  Neurologic: Strength is normal for age in both the upper and lower extremities. Muscle tone is normal. Sensation to touch is normal in the legs and soles of the feet.  Labs: Last hemoglobin A1c:  Lab Results  Component Value Date   HGBA1C 10.3 07/23/2015   Results for orders placed or performed in visit on 08/08/15  POCT Glucose (CBG)  Result Value Ref Range   POC Glucose 194 (A) 70 - 99 mg/dl    Assessment/Plan: [name] is a 20 y.o. male with type 1 diabetes in fair control. [name] continues to work hard to make positive changes to his diabetes care. Switching to the Omnipod has been helpful for him. He is also improving his diet and exercise routines.  1. DM w/o complication type I, uncontrolled (HCC) - Continue checking Bg at least 4 times per day - Bolus with all meals and to correct for high blood sugars - Always keep glucose with you - POCT Glucose (CBG) - Basal Change--> 430am: 1.8--> 1.7 due to early morning lows.    2. Goiter - Continue synthroid daily.    4. Essential hypertension, benign Continue taking lisinopril as prescribed.   5. Hypothyroidism, acquired, autoimmune See number 2.     Follow-up:  1 month   Medical decision-making:  > 40  minutes spent, more than 50% of appointment was spent discussing diagnosis and management of symptoms  Gretchen Short, FNP-C

## 2015-09-07 ENCOUNTER — Other Ambulatory Visit: Payer: Self-pay | Admitting: Family

## 2015-10-30 ENCOUNTER — Encounter: Payer: Self-pay | Admitting: Family

## 2015-10-30 ENCOUNTER — Ambulatory Visit (INDEPENDENT_AMBULATORY_CARE_PROVIDER_SITE_OTHER): Payer: Commercial Managed Care - HMO | Admitting: Family

## 2015-10-30 VITALS — BP 109/66 | HR 79 | Wt 208.4 lb

## 2015-10-30 DIAGNOSIS — E039 Hypothyroidism, unspecified: Secondary | ICD-10-CM

## 2015-10-30 DIAGNOSIS — E109 Type 1 diabetes mellitus without complications: Secondary | ICD-10-CM

## 2015-10-30 DIAGNOSIS — Z4681 Encounter for fitting and adjustment of insulin pump: Secondary | ICD-10-CM | POA: Diagnosis not present

## 2015-10-30 DIAGNOSIS — IMO0001 Reserved for inherently not codable concepts without codable children: Secondary | ICD-10-CM

## 2015-10-30 DIAGNOSIS — I10 Essential (primary) hypertension: Secondary | ICD-10-CM | POA: Diagnosis not present

## 2015-10-30 DIAGNOSIS — E1065 Type 1 diabetes mellitus with hyperglycemia: Principal | ICD-10-CM

## 2015-10-30 LAB — POCT GLYCOSYLATED HEMOGLOBIN (HGB A1C): Hemoglobin A1C: 9.2

## 2015-10-30 LAB — T4, FREE: FREE T4: 0.8 ng/dL (ref 0.8–1.8)

## 2015-10-30 LAB — TSH: TSH: 27.06 mIU/L — ABNORMAL HIGH (ref 0.40–4.50)

## 2015-10-30 LAB — GLUCOSE, POCT (MANUAL RESULT ENTRY): POC GLUCOSE: 214 mg/dL — AB (ref 70–99)

## 2015-10-30 NOTE — Progress Notes (Signed)
Pediatric Endocrinology Diabetes Consultation Follow-up Visit  David [name] 1995-11-12 [id number]  Chief Complaint: Follow-up type 1 diabetes   [name],[name], MD   HPI: David  is a 20 y.o. male presenting for follow-up of type 1 diabetes.   1. 1. David was [facility] for new onset T1DM, dehydration and ketonuria. He was initially treated with a multiple daily injection regimen of Lantus as a basal insulin and Novolog at mealtimes and at other times as needed. He was converted to an insulin pump in 2009. As he has grown to manhood he has required progressively larger doses of insulin. His compliance with checking BGs, giving boluses, and changing sites has varied significantly from time to time. Complications of his diabetes include hypoglycemia and mild peripheral neuropathy.   2. Since last visit to [facility] on 08/2015, he has been well.  No ER visits or hospitalizations. David is very tired, he and his wife had twin girls on July 21st. The girls were healthy but born premature so they were in the NICU for almost two weeks, now they are at home. He is very proud of his daughters but he has realized how tiring having kids is and how much stress it causes.   He has been putting a lot of effort into his diabetes care. 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 He is wearing his Dexcom CGM and is finding it very accurate. He is also wearing the Omnipod insulin pump which he likes much better. He has been trying to bolus prior to meals but it is hard when he is working because he may get a call and have to leave before he gets to eat.   Insulin regimen: Using an Omnipod Insulin pump.  - Basal   - 12am: 1.15  - 430am: 1.80  - 8am: 1.30  - 2pm: 1.50  - 8pm: 1.35 Carb Ratio   - 12am: 12  - 5am: 9  ISF  - 12am: 30  - 5am: 25  - 11am: 30 Hypoglycemia: able to feel low blood sugars.  No glucagon needed recently.  Blood  glucose download: Checking 4.1 times per day. Avg Bg 226. His blood sugars have much less variability then at his previous check.  Last visit: Checking bg 3.1 times per day. Avg Bg 186. Bg Range 44-456. Blood sugars have greatly improved since switching to omnipod.   Med-alert ID: bracelet  Injection sites: arms, legs and abdomen Annual labs due: 2018  Ophthalmology due: 2017. Discussed importance of this appointment at today's visit. 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     3. ROS: Greater than 10 systems reviewed with pertinent positives listed in HPI, otherwise neg. Constitutional: Weight is stable. Feels a little tired.  Eyes: No changes in vision Ears/Nose/Mouth/Throat: No difficulty swallowing. Cardiovascular: No palpitations Respiratory: No increased work of breathing Gastrointestinal: No constipation or diarrhea. No abdominal pain Genitourinary: No nocturia, no polyuria Musculoskeletal: No joint pain Neurologic: Normal sensation, no tremor Endocrine: No polydipsia.  No hyperpigmentation Psychiatric: Normal affect  Past Medical History:  Past Medical History:  Diagnosis Date  . Diabetes mellitus   . Goiter   . Gynecomastia   . Hypoglycemia associated with diabetes (HCC)   . Hypothyroidism, acquired, autoimmune   . Thyroiditis, autoimmune     Medications:  Outpatient Encounter Prescriptions as of 10/30/2015  Medication Sig  . glucagon (GLUCAGON  EMERGENCY) 1 MG injection Inject 1mg  intramuscular as needed for severe Hypoglecemia. Dispense 4= 30 days  . glucose blood (FREESTYLE LITE) test strip Check Bg 10x day  . insulin lispro (HUMALOG) 100 UNIT/ML injection Use up to 300 units in insulin pump  . levothyroxine (SYNTHROID, LEVOTHROID) 200 MCG tablet TALE 2 TABLETS BY MOUTH FIVE DAYS A WEEK AND 3 TABS FOR TWO DAYS A WEEKS  . lisinopril (PRINIVIL,ZESTRIL) 2.5 MG tablet TAKE 1 TABLET BY MOUTH EVERY DAY  . HYDROcodone-homatropine (HYCODAN) 5-1.5 MG/5ML  syrup Take 5 mLs by mouth every 8 (eight) hours as needed for cough. (Patient not taking: Reported on 07/23/2015)   No facility-administered encounter medications on file as of 10/30/2015.     Allergies: No Known Allergies  Surgical History: No past surgical history on file.  Family History:  Family History  Problem Relation Age of Onset  . Diabetes Mother   . Hypothyroidism Mother   . Cancer Mother   . Hypertension Mother   . Cancer Brother   . Hyperthyroidism Paternal Grandfather    Social History: Lives with: his wife and her parents. He is about to be a father of twins.  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   Physical Exam:  Vitals:   10/30/15 0847  BP: 109/66  Pulse: 79  Weight: 94.5 kg (208 lb 6.4 oz)   BP 109/66   Pulse 79   Wt 94.5 kg (208 lb 6.4 oz)   BMI 27.50 kg/m  Body mass index: body mass index is 27.5 kg/m. Growth percentile SmartLinks can only be used for patients less than 144 years old.  Ht Readings from Last 3 Encounters:  05/24/15 6\' 1"  (1.854 m)  08/15/12 6' 0.44" (1.84 m) (86 %, Z= 1.08)*  12/09/11 6' 0.24" (1.835 m) (86 %, Z= 1.06)*   * Growth percentiles are based on CDC 2-20 Years data.   Wt Readings from Last 3 Encounters:  10/30/15 94.5 kg (208 lb 6.4 oz)  08/28/15 92.1 kg (203 lb)  08/08/15 88 kg (194 lb)    Constitutional: David appears healthy, alert and interactive.  Eyes: There is no obvious arcus or proptosis. Moisture appears normal. Mouth: The oropharynx and tongue appear normal. Dentition appears to be normal for age. Oral moisture is normal. Neck: The neck appears to be visibly enlarged. No carotid bruits are noted. Thyroid gland is slightly large in size and normal in consistency. The thyroid gland is not tender to palpation. Lungs: The lungs are clear to auscultation. Air movement is good. Heart: Heart rate and rhythm are regular. Heart sounds S1 and S2 are normal. I did not appreciate any pathologic cardiac  murmurs. Abdomen: The abdomen is normal in size. Bowel sounds are normal. There is no obvious hepatomegaly, splenomegaly, or other mass effect.  Arms: Muscle size and bulk are normal for age. Hands: There is no obvious tremor. Phalangeal and metacarpophalangeal joints are normal. Palmar muscles are normal for age. Palmar skin is normal. Palmar moisture is also normal. Legs: Muscles appear normal for age. No edema is present. Feet: Feet are normally formed. Dorsalis pedal pulses are 1+ bilaterally.  Neurologic: Strength is normal for age in both the upper and lower extremities. Muscle tone is normal. Sensation to touch is normal in the legs and soles of the feet.  Labs: Last hemoglobin A1c:  Lab Results  Component Value Date   HGBA1C 9.2 10/30/2015   Results for orders placed or performed in visit on 10/30/15  POCT Glucose (  CBG)  Result Value Ref Range   POC Glucose 214 (A) 70 - 99 mg/dl  POCT HgB Z6X  Result Value Ref Range   Hemoglobin A1C 9.2     Assessment/Plan: [name] is a 20 y.o. male with type 1 diabetes in fair and improving control. He is testing his blood sugars more in addition to wearing his CGM. He is having much less variability in his blood sugars.   1. DM w/o complication type I, uncontrolled (HCC) - Continue checking Bg at least 4 times per day - Bolus with all meals and to correct for high blood sugars - Always keep glucose with you - POCT Glucose (CBG)    2. Goiter - Continue synthroid daily.  - TFT's   3. Insulin Pump titration   12am: 1.150--> 1.20  - 430am: 1.70 - 8am: 1.30--> 1.35 - 2pm: 1.50--> 1.55 - 8pm: 1.35--> 1.4  4. Essential hypertension, benign Continue taking lisinopril as prescribed.   5. Hypothyroidism, acquired, autoimmune See number 2.     Follow-up:  2 month   Medical decision-making:  > 40  minutes spent, more than 50% of appointment was spent discussing diagnosis and management of symptoms  Gretchen Short,  FNP-C

## 2015-10-30 NOTE — Patient Instructions (Signed)
-   12am: 1.150--> 1.20  - 430am: 1.70 - 8am: 1.30--> 1.35 - 2pm: 1.50--> 1.55 - 8pm: 1.35--> 1.4  - Try bolusing before you eat!  - - Check blood sugar at least 4 x per day  - Keep glucose with you at all times  - Make sure you are giving insulin with each meal and to correct for high blood sugars  - If you need anything, please do nt hesitate to contact me via MyChart or by calling the office.   [phone]

## 2015-11-04 ENCOUNTER — Telehealth: Payer: Self-pay

## 2015-11-04 ENCOUNTER — Other Ambulatory Visit: Payer: Self-pay | Admitting: Family

## 2015-11-04 MED ORDER — LEVOTHYROXINE SODIUM 125 MCG PO TABS
250.0000 ug | ORAL_TABLET | Freq: Every day | ORAL | 1 refills | Status: DC
Start: 2015-11-04 — End: 2016-01-06

## 2015-11-04 NOTE — Telephone Encounter (Signed)
He is returning a call back to [name]. Patient thinks it may have been related to Rx dose due to blood work.

## 2015-11-04 NOTE — Telephone Encounter (Signed)
Routed to provider

## 2015-11-04 NOTE — Telephone Encounter (Signed)
Spoke to David [name]. He has missed 2-3 doses of Synthroid per day over the last month he estimates. Discussed re-drawing labs in 1 month for recheck.

## 2015-12-09 ENCOUNTER — Encounter (INDEPENDENT_AMBULATORY_CARE_PROVIDER_SITE_OTHER): Payer: Self-pay | Admitting: Family

## 2015-12-27 ENCOUNTER — Other Ambulatory Visit: Payer: Self-pay | Admitting: Family

## 2016-01-01 ENCOUNTER — Encounter (INDEPENDENT_AMBULATORY_CARE_PROVIDER_SITE_OTHER): Payer: Self-pay | Admitting: Family

## 2016-01-01 ENCOUNTER — Ambulatory Visit (INDEPENDENT_AMBULATORY_CARE_PROVIDER_SITE_OTHER): Payer: Commercial Managed Care - HMO | Admitting: Family

## 2016-01-01 VITALS — BP 130/66 | HR 69 | Wt 206.6 lb

## 2016-01-01 DIAGNOSIS — Z4681 Encounter for fitting and adjustment of insulin pump: Secondary | ICD-10-CM | POA: Diagnosis not present

## 2016-01-01 DIAGNOSIS — F54 Psychological and behavioral factors associated with disorders or diseases classified elsewhere: Secondary | ICD-10-CM | POA: Diagnosis not present

## 2016-01-01 DIAGNOSIS — E038 Other specified hypothyroidism: Secondary | ICD-10-CM

## 2016-01-01 DIAGNOSIS — I10 Essential (primary) hypertension: Secondary | ICD-10-CM | POA: Diagnosis not present

## 2016-01-01 DIAGNOSIS — IMO0001 Reserved for inherently not codable concepts without codable children: Secondary | ICD-10-CM

## 2016-01-01 DIAGNOSIS — E063 Autoimmune thyroiditis: Secondary | ICD-10-CM

## 2016-01-01 DIAGNOSIS — E1065 Type 1 diabetes mellitus with hyperglycemia: Secondary | ICD-10-CM

## 2016-01-01 LAB — TSH: TSH: 4.99 mIU/L — ABNORMAL HIGH (ref 0.40–4.50)

## 2016-01-01 LAB — GLUCOSE, POCT (MANUAL RESULT ENTRY): POC Glucose: 278 mg/dl — AB (ref 70–99)

## 2016-01-01 LAB — T4, FREE: FREE T4: 1.1 ng/dL (ref 0.8–1.8)

## 2016-01-01 LAB — T4: T4, Total: 7.2 ug/dL (ref 4.5–12.0)

## 2016-01-01 MED ORDER — LISINOPRIL 5 MG PO TABS: 5.0000 mg | ORAL_TABLET | Freq: Every day | ORAL | 3 refills | Status: DC

## 2016-01-01 NOTE — Progress Notes (Addendum)
sPediatric Endocrinology Diabetes Consultation Follow-up Visit  David [name] 05/17/1995 [id number]  Chief Complaint: Follow-up type 1 diabetes   [name],[name], MD   HPI: David  is a 20 y.o. male presenting for follow-up of type 1 diabetes.   1. 1. David was admitted to [facility] on 12/18/05 for new onset T1DM, dehydration and ketonuria. He was initially treated with a multiple daily injection regimen of Lantus as a basal insulin and Novolog at mealtimes and at other times as needed. He was converted to an insulin pump in 2009. As he has grown to manhood he has required progressively larger doses of insulin. His compliance with checking BGs, giving boluses, and changing sites has varied significantly from time to time. Complications of his diabetes include hypoglycemia and mild peripheral neuropathy.   2. Since last visit to [facility] on 10/2015, he has been well.  No ER visits or hospitalizations.   David reports that "things are tough right now". He has 693 month old twin babies that are not sleeping which has both him and his wife tired and stressed. 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 He has applied for jobs at the [employer] fire departments but will not have interviews until January.   He feels like his blood sugars have been high and that even when he gives insulin, they are not coming down as well. He occasionally forgets to calibrate his Dexcom sensor and check his blood sugar. He has not been bolusing before he eats and sometimes boluses an hour or two after eating. He is wearing his Omnipod Insulin pump on his arms only, he also wears a Dexcom CGM.   He reports that he has been taking Synthroid more consistently. He is suppose to be taking 250mcg per day but reports he is only taking 1 pill (125mcg). He denies  constipation/diarrhea, Cold/Heat intolerance. He reports that he is always fatigued but it is because he is not sleeping due to the babies. He misses the Lisinopril for his blood pressure and renal protection about 3 times per week per his estimate.    Insulin regimen: Using an Omnipod Insulin pump.  - Basal   - 12am: 1.20  - 430am: 1.70  - 8am: 1.35  - 2pm: 1.55  - 8pm: 1.40 Carb Ratio   - 12am: 12  - 5am: 9   - 11am: 8 ISF  - 12am: 30  - 5am: 25  - 11am: 30 Hypoglycemia: able to feel low blood sugars.  No glucagon needed recently.  Blood glucose download: Checking bg 2.6 times per day. Avg Bg 302. Bg Range 64-HI.   - In range 12%, Above range 87%, Below range 1%.   - Using 60% basal, 40% bolus on average.  Last visit: Checking 4.1 times per day. Avg Bg 226. His blood sugars have much less variability then at his previous check.   Med-alert ID: bracelet  Injection sites: arms Annual labs due: 2018  Ophthalmology due: 2018. Last eye appointment was August 2017, no signs of diabetic eye disease.     3. ROS: Greater than 10 systems reviewed with pertinent positives listed in HPI, otherwise neg. Constitutional: Reports feeling frustrated and a little tired. Has a good appetite.  Eyes: No changes in vision, denies blurry vision.  Ears/Nose/Mouth/Throat: No difficulty swallowing, denies throat pain.  Cardiovascular: No palpitations, denies tachycardia and chest pain Respiratory: No increased work of breathing, denies SOB.  Gastrointestinal: No constipation or diarrhea. No abdominal pain Genitourinary: No nocturia, no polyuria Musculoskeletal: No joint pain, denies limited ROM of extremities.  Neurologic: Normal sensation, no tremor Endocrine: No polydipsia.  No hyperpigmentation Psychiatric: Normal affect. Acknowledges frustration but denies depression/anxiety.   Past Medical History:  Past Medical History:  Diagnosis Date  . Diabetes mellitus   . Goiter   . Gynecomastia    . Hypoglycemia associated with diabetes (HCC)   . Hypothyroidism, acquired, autoimmune   . Thyroiditis, autoimmune     Medications:  Outpatient Encounter Prescriptions as of 01/01/2016  Medication Sig  . glucagon (GLUCAGON EMERGENCY) 1 MG injection Inject 1mg  intramuscular as needed for severe Hypoglecemia. Dispense 4= 30 days  . glucose blood (FREESTYLE LITE) test strip Check Bg 10x day  . insulin lispro (HUMALOG) 100 UNIT/ML injection Use up to 300 units in insulin pump  . levothyroxine (SYNTHROID) 125 MCG tablet Take 2 tablets (250 mcg total) by mouth daily before breakfast.  . lisinopril (PRINIVIL,ZESTRIL) 2.5 MG tablet TAKE 1 TABLET BY MOUTH EVERY DAY  . HYDROcodone-homatropine (HYCODAN) 5-1.5 MG/5ML syrup Take 5 mLs by mouth every 8 (eight) hours as needed for cough. (Patient not taking: Reported on 01/01/2016)   No facility-administered encounter medications on file as of 01/01/2016.     Allergies: No Known Allergies  Surgical History: No past surgical history on file.  Family History:  Family History  Problem Relation Age of Onset  . Diabetes Mother   . Hypothyroidism Mother   . Cancer Mother   . Hypertension Mother   . Cancer Brother   . Hyperthyroidism Paternal Grandfather    Social History: Lives with: his wife and her parents. Has 24 month old twins.  Works for Warden/ranger at [employer].   Physical Exam:  Vitals:   01/01/16 0856  BP: 130/66  Pulse: 69  Weight: 206 lb 9.6 oz (93.7 kg)   BP 130/66   Pulse 69   Wt 206 lb 9.6 oz (93.7 kg)   BMI 27.26 kg/m  Body mass index: body mass index is 27.26 kg/m. Growth percentile SmartLinks can only be used for patients less than 41 years old.  Ht Readings from Last 3 Encounters:  05/24/15 6\' 1"  (1.854 m)  08/15/12 6' 0.44" (1.84 m) (86 %, Z= 1.08)*  12/09/11 6' 0.24" (1.835 m) (86 %, Z= 1.06)*   * Growth percentiles are based on CDC 2-20 Years data.   Wt Readings from Last 3 Encounters:   01/01/16 206 lb 9.6 oz (93.7 kg)  10/30/15 208 lb 6.4 oz (94.5 kg)  08/28/15 203 lb (92.1 kg)    Constitutional: David appears healthy, alert and interactive. He answers questions appropriately.  Eyes: There is no obvious arcus or proptosis. Moisture appears normal. Mouth: The oropharynx and tongue appear normal. Dentition appears to be normal for age. Oral moisture is normal. Neck: The neck appears to be visibly enlarged. No carotid bruits are noted. Thyroid gland is slightly large in size and normal in consistency. The thyroid gland is not tender to palpation. Lungs: The lungs are clear to auscultation. Air movement is good. Heart: Heart rate and rhythm are regular. Heart sounds S1 and S2 are normal. I did not appreciate any pathologic cardiac murmurs. Abdomen: The abdomen is normal in size. Bowel sounds are normal. There is no obvious hepatomegaly, splenomegaly, or other mass effect.  Arms: Muscle size and  bulk are normal for age. Hands: There is no obvious tremor. Phalangeal and metacarpophalangeal joints are normal. Palmar muscles are normal for age. Palmar skin is normal. Palmar moisture is also normal. Legs: Muscles appear normal for age. No edema is present. Feet: Feet are normally formed. Dorsalis pedal pulses are 1+ bilaterally.  Neurologic: Strength is normal for age in both the upper and lower extremities. Muscle tone is normal. Sensation to touch is normal in the legs and soles of the feet.  Labs: Last hemoglobin A1c:   Results for orders placed or performed in visit on 01/01/16  POCT Glucose (CBG)  Result Value Ref Range   POC Glucose 278 (A) 70 - 99 mg/dl    Assessment/Plan: David is a 20 y.o. male with type 1 diabetes in fair control. He is struggling with his diabetes care at the moment due to new life stressors. He needs to be more consistent with his diabetes care. Giving insulin prior to eating will help decrease spikes in blood sugar. Needs more Lisinopril for  his blood pressures and for renal protection. Needs to have TFT's repeated.   1. DM w/o complication type I, uncontrolled (HCC) - Check blood sugars at least 4 x per day  - Calibrate Dexcom 2 x per day (discussed CGM download and pump download)  - Bolus with all meals and to correct for high blood sugars - Always keep glucose with you - POCT Glucose (CBG  2. Goiter/ Hypothyroid - Continue 125 mcg of synthroid daily - Repeat TFT's today   3. Insulin Pump titration  Carb Ratio  5am: 9--> 7  11am: 8--> 6   4. Essential hypertension, benign Increase Lisinopril to 5mg  daily. Needs to take daily  - Stress importance of taking daily   5. Maladaptive Behaviors  - Discussed issues that are interfering with diabetes care  - Will work hard to get back on track  - Encouragement given and questions answered.       Follow-up:  2 month   Medical decision-making:  > 40  minutes spent, more than 50% of appointment was spent discussing diagnosis and management of symptoms  Gretchen Short, FNP-C

## 2016-01-01 NOTE — Patient Instructions (Signed)
5am: 9--> 7  11am: 8--> 6  - Try bolusing PRIOR to eating  - Pre bolus for at least 50 grams of carbs, if you eat more, give a bolus for the rest when you finish eating  - Make sure you are checking bg at least 3 x per day  - Make sure to calibrate Dexcom CGM at least 2 times per day   - Follow up in 2 months

## 2016-01-06 ENCOUNTER — Other Ambulatory Visit (INDEPENDENT_AMBULATORY_CARE_PROVIDER_SITE_OTHER): Payer: Self-pay | Admitting: Family

## 2016-01-06 ENCOUNTER — Telehealth (INDEPENDENT_AMBULATORY_CARE_PROVIDER_SITE_OTHER): Payer: Self-pay | Admitting: Family

## 2016-01-06 MED ORDER — LEVOTHYROXINE SODIUM 150 MCG PO TABS: 150.0000 ug | ORAL_TABLET | Freq: Every day | ORAL | 4 refills | Status: DC

## 2016-01-06 NOTE — Telephone Encounter (Signed)
Discussed thyroid labs. Increase synthroid from per day to per day.

## 2016-02-09 ENCOUNTER — Other Ambulatory Visit: Payer: Self-pay | Admitting: Family

## 2016-02-10 ENCOUNTER — Other Ambulatory Visit: Payer: Self-pay | Admitting: Family

## 2016-02-13 ENCOUNTER — Other Ambulatory Visit (INDEPENDENT_AMBULATORY_CARE_PROVIDER_SITE_OTHER): Payer: Self-pay | Admitting: *Deleted

## 2016-02-13 DIAGNOSIS — E1065 Type 1 diabetes mellitus with hyperglycemia: Principal | ICD-10-CM

## 2016-02-13 DIAGNOSIS — IMO0001 Reserved for inherently not codable concepts without codable children: Secondary | ICD-10-CM

## 2016-02-13 DIAGNOSIS — E039 Hypothyroidism, unspecified: Secondary | ICD-10-CM

## 2016-02-13 MED ORDER — LEVOTHYROXINE SODIUM 150 MCG PO TABS
150.0000 ug | ORAL_TABLET | Freq: Every day | ORAL | 4 refills | Status: DC
Start: 2016-02-13 — End: 2016-12-14

## 2016-02-13 MED ORDER — INSULIN LISPRO 100 UNIT/ML ~~LOC~~ SOLN: SUBCUTANEOUS | 5 refills | Status: DC

## 2016-03-10 ENCOUNTER — Other Ambulatory Visit (INDEPENDENT_AMBULATORY_CARE_PROVIDER_SITE_OTHER): Payer: Self-pay | Admitting: *Deleted

## 2016-03-10 ENCOUNTER — Ambulatory Visit (INDEPENDENT_AMBULATORY_CARE_PROVIDER_SITE_OTHER): Payer: Commercial Managed Care - HMO | Admitting: Family

## 2016-03-10 ENCOUNTER — Encounter (INDEPENDENT_AMBULATORY_CARE_PROVIDER_SITE_OTHER): Payer: Self-pay | Admitting: Family

## 2016-03-10 VITALS — BP 118/70 | HR 86 | Ht 72.84 in | Wt 213.6 lb

## 2016-03-10 DIAGNOSIS — E034 Atrophy of thyroid (acquired): Secondary | ICD-10-CM

## 2016-03-10 DIAGNOSIS — E038 Other specified hypothyroidism: Secondary | ICD-10-CM

## 2016-03-10 DIAGNOSIS — E063 Autoimmune thyroiditis: Secondary | ICD-10-CM

## 2016-03-10 DIAGNOSIS — E1065 Type 1 diabetes mellitus with hyperglycemia: Principal | ICD-10-CM

## 2016-03-10 DIAGNOSIS — IMO0001 Reserved for inherently not codable concepts without codable children: Secondary | ICD-10-CM

## 2016-03-10 DIAGNOSIS — F54 Psychological and behavioral factors associated with disorders or diseases classified elsewhere: Secondary | ICD-10-CM | POA: Diagnosis not present

## 2016-03-10 DIAGNOSIS — I10 Essential (primary) hypertension: Secondary | ICD-10-CM

## 2016-03-10 LAB — POCT GLYCOSYLATED HEMOGLOBIN (HGB A1C): Hemoglobin A1C: 10.8

## 2016-03-10 LAB — GLUCOSE, POCT (MANUAL RESULT ENTRY): POC GLUCOSE: 226 mg/dL — AB (ref 70–99)

## 2016-03-10 NOTE — Progress Notes (Signed)
Pediatric Endocrinology Diabetes Consultation Follow-up Visit  David [name] C [place] 08-20-1995 [id number]  Chief Complaint: Follow-up type 1 diabetes   [name],[name], MD   HPI: David [name]  is a 21 y.o. male presenting for follow-up of type 1 diabetes.   1. 1. David [name] was [facility] to [facility] 12[facility] for new onset T1DM, dehydration and ketonuria. He was initially treated with a multiple daily injection regimen of Lantus as a basal insulin and Novolog at mealtimes and at other times as needed. He was converted to an insulin pump in 2009. As he has grown to manhood he has required progressively larger doses of insulin. His compliance with checking BGs, giving boluses, and changing sites has varied significantly from time to time. Complications of his diabetes include hypoglycemia and mild peripheral neuropathy.   2. Since last visit to [facility] on 01/06/2016, he has been well.  No ER visits or hospitalizations.   David [name] feels like things have improved since his last visit. He is still very tired from having 525 month old twins, they are now rolling over, but he feels like his care and energy have improved. He also found out that he will be able to keep his job. He has made an effort to check blood sugars more consistently but acknowledges that he continues to struggle to bolus as often as he should. He is wearing a Dexcom CGM and Omnipod insulin pump.  David [name] states that he has been sick a lot since his last visit. He had an abscess on his neck that required lancing and drainage, they did not culture it but he was placed on antibiotics. He developed a second abscess, this time under his arm. He had it lanced and drained and has 5 more days left on his course of antibiotics. His blood sugars have been running higher while he has been sick.    He reports that he has been taking Synthroid consistently, rarely missing a dose. He has been taking 150mcg daily. He is also taking Lisinopril daily, he reports he has done  much better remembering to take both of these medications.     Insulin regimen: Using an Omnipod Insulin pump.  - Basal   - 12am: 1.20  - 430am: 1.70  - 8am: 1.35  - 2pm: 1.55  - 8pm: 1.40 Carb Ratio   - 12am: 12  - 5am: 7  - 11am: 6 ISF  - 12am: 30  - 5am: 25  - 11am: 30 Hypoglycemia: able to feel low blood sugars.  No glucagon needed recently.  Blood glucose download: Checking bg 2.7 times per day. Avg Bg 267. Bg Range 75-469  - In range 16%, Above range 84%, Below range 0%.   - Using 62% basal, 38% bolus on average.  Dexcom: Avg Bg 239. Calibrating 1.1 times per day.  Med-alert ID: bracelet  Injection sites: arms Annual labs due: 2018  Ophthalmology due: 2018. Last eye appointment was August 2017, no signs of diabetic eye disease.     3. ROS: Greater than 10 systems reviewed with pertinent positives listed in HPI, otherwise neg. Constitutional: Reports that he has better energy and appetite. Has been sick recently  Eyes: No changes in vision, denies blurry vision.  Ears/Nose/Mouth/Throat: No difficulty swallowing, denies throat pain.  Cardiovascular: No palpitations, denies tachycardia and chest pain Respiratory: No increased work of breathing, denies SOB.  Gastrointestinal: No constipation or diarrhea. No abdominal pain Genitourinary: No nocturia, no polyuria Musculoskeletal: No joint pain, denies limited ROM of extremities.  Neurologic: Normal sensation, no tremor Endocrine: No polydipsia.  No hyperpigmentation Psychiatric: Normal affect. Acknowledges frustration at times but denies depression/anxiety.   Past Medical History:  Past Medical History:  Diagnosis Date  . Diabetes mellitus   . Goiter   . Gynecomastia   . Hypoglycemia associated with diabetes (HCC)   . Hypothyroidism, acquired, autoimmune   . Thyroiditis, autoimmune     Medications:  Outpatient Encounter Prescriptions as of 03/10/2016  Medication Sig  . glucagon (GLUCAGON EMERGENCY) 1 MG  injection Inject 1mg  intramuscular as needed for severe Hypoglecemia. Dispense 4= 30 days  . glucose blood (FREESTYLE LITE) test strip Check Bg 10x day  . insulin lispro (HUMALOG) 100 UNIT/ML injection Use up to 300 units in insulin pump  . levothyroxine (SYNTHROID) 150 MCG tablet Take 1 tablet (150 mcg total) by mouth daily before breakfast.  . lisinopril (PRINIVIL,ZESTRIL) 5 MG tablet Take 1 tablet (5 mg total) by mouth daily.  Marland Kitchen HYDROcodone-homatropine (HYCODAN) 5-1.5 MG/5ML syrup Take 5 mLs by mouth every 8 (eight) hours as needed for cough. (Patient not taking: Reported on 03/10/2016)   No facility-administered encounter medications on file as of 03/10/2016.     Allergies: No Known Allergies  Surgical History: No past surgical history on file.  Family History:  Family History  Problem Relation Age of Onset  . Diabetes Mother   . Hypothyroidism Mother   . Cancer Mother   . Hypertension Mother   . Cancer Brother   . Hyperthyroidism Paternal Grandfather    Social History: Lives with: his wife and her parents. Has 68 month old twins.  Works for Warden/ranger at [employer].   Physical Exam:  Vitals:   03/10/16 0928  BP: 118/70  Pulse: 86  Weight: 213 lb 9.6 oz (96.9 kg)  Height: 6' 0.83" (1.85 m)   BP 118/70   Pulse 86   Ht 6' 0.83" (1.85 m)   Wt 213 lb 9.6 oz (96.9 kg)   BMI 28.31 kg/m  Body mass index: body mass index is 28.31 kg/m. Growth percentile SmartLinks can only be used for patients less than 60 years old.  Ht Readings from Last 3 Encounters:  03/10/16 6' 0.83" (1.85 m)  05/24/15 6\' 1"  (1.854 m)  08/15/12 6' 0.44" (1.84 m) (86 %, Z= 1.08)*   * Growth percentiles are based on CDC 2-20 Years data.   Wt Readings from Last 3 Encounters:  03/10/16 213 lb 9.6 oz (96.9 kg)  01/01/16 206 lb 9.6 oz (93.7 kg)  10/30/15 208 lb 6.4 oz (94.5 kg)   Physical Exam  General: Well developed, well nourished male in no acute distress.  He is talkative and  appropriate during visit.  Head: Normocephalic, atraumatic.   Eyes:  Pupils equal and round. EOMI.  Sclera white.  No eye drainage.   Ears/Nose/Mouth/Throat: Nares patent, no nasal drainage.  Normal dentition, mucous membranes moist.  Oropharynx intact. Neck: supple, no cervical lymphadenopathy, no thyromegaly Cardiovascular: regular rate, normal S1/S2, no murmurs Respiratory: No increased work of breathing.  Lungs clear to auscultation bilaterally.  No wheezes. Abdomen: soft, nontender, nondistended. Normal bowel sounds.  No appreciable masses  Extremities: warm, well perfused, cap refill < 2 sec.   Musculoskeletal: Normal muscle mass.  Normal strength Skin: warm, dry.  No rash or lesions. Neurologic: alert and oriented, normal speech and gait  Labs: Last hemoglobin A1c:   Results for orders placed or performed in visit on 03/10/16  POCT Glucose (CBG)  Result Value Ref  Range   POC Glucose 226 (A) 70 - 99 mg/dl  POCT HgB G9F  Result Value Ref Range   Hemoglobin A1C 10.8     Assessment/Plan: [name] is a 21 y.o. male with type 1 diabetes in poor (worsening) control. Javares's A1c has increased by more then 1% since his last visit. He is not bolusing as often as he should which is resulting in more hyperglycemia. He has also had two infections recently. His blood pressure has improved on Lisinopril. He is clinically euthyroid. It is also time to transition Jac's diabetes care to an adult endocrinologist since he is almost 21 years old. He is in agreement with plan.   1. DM w/o complication type I, uncontrolled (HCC) - Check blood sugars at least 4 x per day  - Calibrate Dexcom 2 x per day  - Bolus with all meals and to correct for high blood sugars - Always keep glucose with you - POCT Glucose  - A1c as above.  - Reviewed blood sugar log and Dexcom Report with patient.   2. Goiter/ Hypothyroid - Continue of synthroid daily - Repeat TFT's today   3. Insulin Pump  titration  Basal Changes  - 12am: 1.20--> 1.250  - 430am: 1.70 - 8am; 1.35--> 1.4  - 2pm: 1.55--> 1.60  - 8pm: 1.40--> 1.45   4. Essential hypertension, benign - Continue Lisinopril to 5mg  daily. - Stress importance of taking daily   5. Maladaptive Behaviors  - Discussed issues that are interfering with diabetes care  - Will work hard to get back on track  - Encouragement given and questions answered.        Medical decision-making:  > 40  minutes spent, more than 50% of appointment was spent discussing diagnosis and management of symptoms  Gretchen Short, FNP-C

## 2016-03-10 NOTE — Patient Instructions (Addendum)
Basal Changes  - 12am: 1.20--> 1.250  - 430am: 1.70 - 8am; 1.35--> 1.4  - 2pm: 1.55--> 1.60  - 8pm: 1.40--> 1.45  - Continue 150mcg of synthroid  - TFT today  - YOU NEED TO BOLUS!  - Check blood sugar at least 4 x per day  - Keep glucose with you at all times  - Make sure you are giving insulin with each meal and to correct for high blood sugars  - If you need anything, please do nt hesitate to contact me via MyChart or by calling the office.   [phone]

## 2016-03-11 ENCOUNTER — Ambulatory Visit (INDEPENDENT_AMBULATORY_CARE_PROVIDER_SITE_OTHER): Payer: Commercial Managed Care - HMO | Admitting: Family

## 2016-03-11 LAB — T4, FREE: Free T4: 0.9 ng/dL (ref 0.8–1.8)

## 2016-03-11 LAB — TSH: TSH: 22.99 m[IU]/L — AB (ref 0.40–4.50)

## 2016-03-12 ENCOUNTER — Telehealth (INDEPENDENT_AMBULATORY_CARE_PROVIDER_SITE_OTHER): Payer: Self-pay | Admitting: Family

## 2016-03-12 NOTE — Telephone Encounter (Signed)
Called and left message with thyroid labs. Asked David [name] to return call.

## 2016-06-13 ENCOUNTER — Other Ambulatory Visit (INDEPENDENT_AMBULATORY_CARE_PROVIDER_SITE_OTHER): Payer: Self-pay | Admitting: Family

## 2016-07-02 ENCOUNTER — Encounter (INDEPENDENT_AMBULATORY_CARE_PROVIDER_SITE_OTHER): Payer: Self-pay | Admitting: Family

## 2016-07-02 ENCOUNTER — Telehealth (INDEPENDENT_AMBULATORY_CARE_PROVIDER_SITE_OTHER): Payer: Self-pay | Admitting: Family

## 2016-07-02 ENCOUNTER — Other Ambulatory Visit (INDEPENDENT_AMBULATORY_CARE_PROVIDER_SITE_OTHER): Payer: Self-pay | Admitting: *Deleted

## 2016-07-02 NOTE — Telephone Encounter (Signed)
LVM to advise that spoke with David [name] at [facility] and she stated avail for Monday. She should be calling him today to offer appointment.

## 2016-07-02 NOTE — Telephone Encounter (Signed)
°  Who's calling (name and relationship to patient) : [name] (pt)  Best contact number: [phone]  Provider they see: Ovidio Kin  Reason for call: Following up on transfer to [facility].  Please call DMV paperwork is due soon and need to get an appt there.  Please call patient. Patient wants to come by and get DMV paperwork signed. Please call back.      PRESCRIPTION REFILL ONLY  Name of prescription:  Pharmacy:

## 2016-07-02 NOTE — Telephone Encounter (Signed)
°  Who's calling (name and relationship to patient) :  Best contact number:  Provider they see:  Reason for call: Following up on transfer to [facility].  Please call DMV paperwork is due soon and need to get an appt there.  Please call patient. Patient wants to come by and get DMV paperwork signed. Please call back.    PRESCRIPTION REFILL ONLY  Name of prescription:  Pharmacy:

## 2016-07-02 NOTE — Telephone Encounter (Signed)
Called [facility] to check on Referral and spoke with Judeth Cornfield, she said she can bring call him and offer him an appointment for Monday.

## 2016-07-06 ENCOUNTER — Encounter: Payer: Self-pay | Admitting: Endocrinology

## 2016-07-06 ENCOUNTER — Ambulatory Visit (INDEPENDENT_AMBULATORY_CARE_PROVIDER_SITE_OTHER): Payer: Commercial Managed Care - HMO | Admitting: Endocrinology

## 2016-07-06 VITALS — BP 118/66 | HR 70 | Ht 73.0 in | Wt 226.0 lb

## 2016-07-06 DIAGNOSIS — E063 Autoimmune thyroiditis: Secondary | ICD-10-CM

## 2016-07-06 LAB — POCT GLYCOSYLATED HEMOGLOBIN (HGB A1C): HEMOGLOBIN A1C: 8.8

## 2016-07-06 MED ORDER — OMNIPOD MISC: 1.0000 | 11 refills | Status: DC

## 2016-07-06 MED ORDER — DEXCOM G5 MOB/G4 PLAT SENSOR MISC: 1.0000 | 11 refills | Status: DC

## 2016-07-06 MED ORDER — DEXCOM G5 MOBILE TRANSMITTER MISC: 1.0000 | 3 refills | Status: DC

## 2016-07-06 NOTE — Patient Instructions (Addendum)
good diet and exercise significantly improve the control of your diabetes.  please let me know if you wish to be referred to a dietician.  high blood sugar is very risky to your health.  you should see an eye doctor and dentist every year.  It is very important to get all recommended vaccinations.  Controlling your blood pressure and cholesterol drastically reduces the damage diabetes does to your body.  Those who smoke should quit.  Please discuss these with your doctor.  check your blood sugar twice a day, to calibrate your monitor.  vary the time of day when you check, between before the 3 meals, and at bedtime.  also check if you have symptoms of your blood sugar being too high or too low.  please keep a record of the readings and bring it to your next appointment here (or you can bring the meter itself).  You can write it on any piece of paper.  please call us sooner if your blood sugar goes below 70, or if you have a lot of readings over 200.  Please take the these settings:  basal rate of 2 units/hr, 6 AM-10 PM, and 1 unit/hr, overnight.  mealtime bolus of 2 units per meal.   correction bolus (which some people call "sensitivity," or "insulin sensitivity ratio," or just "isr") of 1 unit for each by which your glucose exceeds 100.  Please call us in a few days, to tell us how the blood sugar is doing.  Please come back for a follow-up appointment in 1 month.

## 2016-07-06 NOTE — Progress Notes (Signed)
Subjective:    Patient ID: David [name], male    DOB: [birth year], 21 y.o.   MRN: [id number]  HPI pt is referred by for diabetes.  Pt states DM was dx'ed in 2007; he has mild if any neuropathy of the lower extremities; he is unaware of any associated chronic complications; he has been on insulin since dx; he uses an omnipod pump, and Dexcom G5 continuous glucose monitor; pt says his diet is not good, but exercise is good; he has never had pancreatitis, pancreatic surgery, severe hypoglycemia or DKA.  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  He seldom has hypoglycemia, and these episodes are mild.   He takes these settings: 5 basal rates of units/hr, varying from 1.25-1.7 units/hr mealtime boluses varying from of 1 unit/ 6-12 grams carbohydrate.  correction bolus (which some people call "sensitivity," or "insulin sensitivity ratio," or just "isr") of 1 unit for each by 25-30 which glucose exceeds 100.  He takes a total of 50-84 units/day, via the pump.  He says it is lower when he misses the boluses.   continuous glucose monitor is downloaded today, and the printout is scanned into the record.  It varies from 40-500.  There is no trend throughout the day.  Past Medical History:  Diagnosis Date  . Diabetes mellitus   . Goiter   . Gynecomastia   . Hypoglycemia associated with diabetes (HCC)   . Hypothyroidism, acquired, autoimmune   . Thyroiditis, autoimmune     No past surgical history on file.  Social History   Social History  . Marital status: Single    Spouse name: N/A  . Number of children: N/A  . Years of education: N/A   Occupational History  . Not on file.   Social History Main Topics  . Smoking status: Never Smoker  . Smokeless tobacco: Never Used  . Alcohol use No  . Drug use: No  . Sexual activity: Yes    Partners: Female   Other Topics Concern  . Not on file   Social History Narrative  . No narrative on file    Current Outpatient  Prescriptions on File Prior to Visit  Medication Sig Dispense Refill  . glucose blood (FREESTYLE LITE) test strip Check Bg 10x day 300 each 6  . insulin lispro (HUMALOG) 100 UNIT/ML injection Use up to 300 units in insulin pump 40 mL 5  . levothyroxine (SYNTHROID) 150 MCG tablet Take 1 tablet (150 mcg total) by mouth daily before breakfast. 30 tablet 4  . lisinopril (PRINIVIL,ZESTRIL) 5 MG tablet TAKE 1 TABLET (5 MG TOTAL) BY MOUTH DAILY. 30 tablet 3  . glucagon (GLUCAGON EMERGENCY) 1 MG injection Inject  intramuscular as needed for severe Hypoglecemia. Dispense 4= 30 days (Patient not taking: Reported on 07/06/2016) 4 each 4   No current facility-administered medications on file prior to visit.     No Known Allergies  Family History  Problem Relation Age of Onset  . Diabetes Mother   . Hypothyroidism Mother   . Cancer Mother   . Hypertension Mother   . Cancer Brother   . Hyperthyroidism Paternal Grandfather     BP 118/66   Pulse 70   Ht  (1.854 m)   Wt 226 lb (102.5 kg)   SpO2 97%   BMI 29.82 kg/m   Review of Systems denies weight loss, blurry vision, headache, chest pain, sob, n/v, urinary frequency, muscle cramps, excessive diaphoresis, depression, cold  intolerance, rhinorrhea, and easy bruising.       Objective:   Physical Exam VS: see vs page GEN: no distress HEAD: head: no deformity eyes: no periorbital swelling, no proptosis.  external nose and ears are normal mouth: no lesion seen NECK: supple, thyroid is not enlarged CHEST WALL: no deformity LUNGS: clear to auscultation CV: reg rate and rhythm, no murmur ABD: abdomen is soft, nontender.  no hepatosplenomegaly.  not distended.  no hernia.  continuous glucose monitor is in place MUSCULOSKELETAL: muscle bulk and strength are grossly normal.  no obvious joint swelling.  gait is normal and steady EXTEMITIES: no deformity.  no ulcer on the feet.  feet are of normal color and temp.  no edema. omnipod is at the  right upper arm.   PULSES: dorsalis pedis intact bilat.  no carotid bruit.  NEURO:  cn 2-12 grossly intact.   readily moves all 4's.  sensation is intact to touch on the feet.  SKIN:  Normal texture and temperature.  No rash or suspicious lesion is visible.   NODES:  None palpable at the neck.   PSYCH: alert, well-oriented.  Does not appear anxious nor depressed.   a1c=8.8%  I personally reviewed electrocardiogram tracing (10/25/14):  Indication: DM  Impression: NSR.  No MI.  No hypertrophy. No comparison is available    Assessment & Plan:  DM: new to me: type 1 is dx'ed based on lean body habitus and age. Due to widely varying cbg without any pattern, he should emphasize basal rate.   Patient Instructions  good diet and exercise significantly improve the control of your diabetes.  please let me know if you wish to be referred to a dietician.  high blood sugar is very risky to your health.  you should see an eye doctor and dentist every year.  It is very important to get all recommended vaccinations.  Controlling your blood pressure and cholesterol drastically reduces the damage diabetes does to your body.  Those who smoke should quit.  Please discuss these with your doctor.  check your blood sugar twice a day, to calibrate your monitor.  vary the time of day when you check, between before the 3 meals, and at bedtime.  also check if you have symptoms of your blood sugar being too high or too low.  please keep a record of the readings and bring it to your next appointment here (or you can bring the meter itself).  You can write it on any piece of paper.  please call us sooner if your blood sugar goes below 70, or if you have a lot of readings over 200.  Please take the these settings:  basal rate of 2 units/hr, 6 AM-10 PM, and 1 unit/hr, overnight.  mealtime bolus of 2 units per meal.   correction bolus (which some people call "sensitivity," or "insulin sensitivity ratio," or just "isr") of 1  unit for each by which your glucose exceeds 100.  Please call us in a few days, to tell us how the blood sugar is doing.  Please come back for a follow-up appointment in 1 month.

## 2016-08-04 ENCOUNTER — Encounter: Payer: Self-pay | Admitting: Endocrinology

## 2016-08-04 ENCOUNTER — Ambulatory Visit (INDEPENDENT_AMBULATORY_CARE_PROVIDER_SITE_OTHER): Payer: Commercial Managed Care - HMO | Admitting: Endocrinology

## 2016-08-04 VITALS — BP 126/70 | HR 73 | Ht 72.0 in | Wt 227.0 lb

## 2016-08-04 DIAGNOSIS — IMO0001 Reserved for inherently not codable concepts without codable children: Secondary | ICD-10-CM

## 2016-08-04 DIAGNOSIS — E1065 Type 1 diabetes mellitus with hyperglycemia: Secondary | ICD-10-CM

## 2016-08-04 NOTE — Patient Instructions (Addendum)
check your blood sugar twice a day, to calibrate your monitor.  vary the time of day when you check, between before the 3 meals, and at bedtime.  also check if you have symptoms of your blood sugar being too high or too low.  please keep a record of the readings and bring it to your next appointment here (or you can bring the meter itself).  You can write it on any piece of paper.  please call us sooner if your blood sugar goes below 70, or if you have a lot of readings over 200.  Please take the these settings:  basal rate of 3 units/hr, 6 AM-10 PM, and 1.2 units/hr, overnight.  mealtime bolus of 2 units with breakfast and lunch, and 8 units with supper.  You can vary this by 1 unit each way, according to the size of his meal.  Also skip the breakfast bolus if you are going to be active.   correction bolus (which some people call "sensitivity," or "insulin sensitivity ratio," or just "isr") of 1 unit for each 50 by which your glucose exceeds 100.  Please call us next week, to tell us how the blood sugar is doing.   Please come back for a follow-up appointment in 6 weeks.

## 2016-08-04 NOTE — Progress Notes (Signed)
Subjective:    Patient ID: David [name], male    DOB: 1995/11/13, 21 y.o.   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 mealtime boluses varying from of 1 unit/ 6-8 grams carbohydrate(he did not adjust at last ov).  This results in mealtime boluses of 10-12 units per meal.   correction bolus (which some people call "sensitivity," or "insulin sensitivity ratio," or just "isr") of 1 unit for each by 25-30 which glucose exceeds 100.  continuous glucose monitor is downloaded today, and the printout is scanned into the record.  It varies from 60-400.  However, most are in the 200's.  There is no trend throughout the day. no cbg record, but states cbg's are mildly low during the day approx 1-2 times twice a week.  This usually happens before lunch. [facility]whether he is at [facility] or [facility]).  pt states he feels well in general.   Past Medical History:  Diagnosis Date  . Diabetes mellitus   . Goiter   . Gynecomastia   . Hypoglycemia associated with diabetes (HCC)   . Hypothyroidism, acquired, autoimmune   . Thyroiditis, autoimmune     No past surgical history on file.  Social History   Social History  . Marital status: Single    Spouse name: N/A  . Number of children: N/A  . Years of education: N/A   Occupational History  . Not on file.   Social History Main Topics  . Smoking status: Never Smoker  . Smokeless tobacco: Never Used  . Alcohol use No  . Drug use: No  . Sexual activity: Yes    Partners: Female   Other Topics Concern  . Not on file   Social History  Narrative  . No narrative on file    Current Outpatient Prescriptions on File Prior to Visit  Medication Sig Dispense Refill  . Continuous Blood Gluc Sensor (DEXCOM G5 MOB/G4 PLAT SENSOR) MISC 1 Device by Does not apply route once a week. 4 each 11  . Continuous Blood Gluc Transmit (DEXCOM G5 MOBILE TRANSMITTER) MISC 1 Device by Does not apply route every 3 (three) months. 1 each 3  . glucagon (GLUCAGON EMERGENCY) 1 MG injection Inject 1mg  intramuscular as needed for severe Hypoglecemia. Dispense 4= 30 days 4 each 4  . glucose blood (FREESTYLE LITE) test strip Check Bg 10x day 300 each 6  . Insulin Disposable Pump (OMNIPOD) MISC 1 Device by Does not apply route every 3 (three) days. 15 each 11  . insulin lispro (HUMALOG) 100 UNIT/ML injection Use up to 300 units in insulin pump 40 mL 5  . levothyroxine (SYNTHROID) 150 MCG tablet Take 1 tablet (150 mcg total) by mouth daily before breakfast. 30 tablet 4  . lisinopril (PRINIVIL,ZESTRIL) 5 MG tablet TAKE 1 TABLET (5 MG TOTAL) BY MOUTH DAILY. 30 tablet 3   No current facility-administered medications on file prior to visit.     No Known Allergies  Family History  Problem Relation Age of Onset  . Diabetes Mother   . Hypothyroidism Mother   .  Cancer Mother   . Hypertension Mother   . Cancer Brother   . Hyperthyroidism Paternal Grandfather     BP 126/70   Pulse 73   Ht 6' (1.829 m)   Wt 227 lb (103 kg)   SpO2 98%   BMI 30.79 kg/m    Review of Systems Denies LOC    Objective:   Physical Exam VITAL SIGNS:  See vs page GENERAL: no distress Pulses: dorsalis pedis intact bilat.   MSK: no deformity of the feet CV: no leg edema Skin:  no ulcer on the feet.  normal color and temp on the feet.  Neuro: sensation is intact to touch on the feet.        Assessment & Plan:  Type 1 DM: he needs increased rx.   Occupational status: this complicates interpretation of cbg pattern throughout the day.    Patient Instructions  check  your blood sugar twice a day, to calibrate your monitor.  vary the time of day when you check, between before the 3 meals, and at bedtime.  also check if you have symptoms of your blood sugar being too high or too low.  please keep a record of the readings and bring it to your next appointment here (or you can bring the meter itself).  You can write it on any piece of paper.  please call us sooner if your blood sugar goes below 70, or if you have a lot of readings over 200.  Please take the these settings:  basal rate of 3 units/hr, 6 AM-10 PM, and 1.2 units/hr, overnight.  mealtime bolus of 2 units with breakfast and lunch, and 8 units with supper.  You can vary this by 1 unit each way, according to the size of his meal.  Also skip the breakfast bolus if you are going to be active.   correction bolus (which some people call "sensitivity," or "insulin sensitivity ratio," or just "isr") of 1 unit for each 50 by which your glucose exceeds 100.  Please call us next week, to tell us how the blood sugar is doing.   Please come back for a follow-up appointment in 6 weeks.

## 2016-09-03 ENCOUNTER — Ambulatory Visit
Admission: RE | Admit: 2016-09-03 | Discharge: 2016-09-03 | Disposition: A | Discharge status: Home / Self Care | Payer: No Typology Code available for payment source | Source: Ambulatory Visit | Attending: Occupational Medicine | Admitting: Occupational Medicine

## 2016-09-03 ENCOUNTER — Other Ambulatory Visit: Payer: Self-pay | Admitting: Occupational Medicine

## 2016-09-03 DIAGNOSIS — Z021 Encounter for pre-employment examination: Secondary | ICD-10-CM

## 2016-09-04 ENCOUNTER — Telehealth: Payer: Self-pay | Admitting: Endocrinology

## 2016-09-04 NOTE — Telephone Encounter (Signed)
Patient needs A1C faxed to Dr. Bethena MidgetSteve Hubbard with [employer] to the fax: [phone]. Call patient once this has been done.

## 2016-09-07 NOTE — Telephone Encounter (Signed)
Report faxed to Dr. Daryel GeraldHubbard's office.

## 2016-09-16 ENCOUNTER — Ambulatory Visit: Payer: Self-pay | Admitting: Endocrinology

## 2016-09-17 ENCOUNTER — Other Ambulatory Visit (INDEPENDENT_AMBULATORY_CARE_PROVIDER_SITE_OTHER): Payer: Self-pay | Admitting: Family

## 2016-09-24 ENCOUNTER — Other Ambulatory Visit (INDEPENDENT_AMBULATORY_CARE_PROVIDER_SITE_OTHER): Payer: Self-pay | Admitting: Family

## 2016-10-23 ENCOUNTER — Encounter: Payer: Self-pay | Admitting: Endocrinology

## 2016-10-23 ENCOUNTER — Ambulatory Visit (INDEPENDENT_AMBULATORY_CARE_PROVIDER_SITE_OTHER): Payer: 59 | Admitting: Endocrinology

## 2016-10-23 VITALS — BP 132/88 | HR 76 | Wt 230.8 lb

## 2016-10-23 DIAGNOSIS — E1065 Type 1 diabetes mellitus with hyperglycemia: Secondary | ICD-10-CM

## 2016-10-23 DIAGNOSIS — IMO0001 Reserved for inherently not codable concepts without codable children: Secondary | ICD-10-CM

## 2016-10-23 LAB — POCT GLYCOSYLATED HEMOGLOBIN (HGB A1C): HEMOGLOBIN A1C: 8.6

## 2016-10-23 NOTE — Progress Notes (Signed)
Subjective:    Patient ID: David [name], male    DOB: 06-15-1995, 21 y.o.   MRN: [id number]  HPI Pt returns for f/u of diabetes mellitus: DM type: 1 Dx'ed: 2007 Complications: none Therapy: insulin since dx  DKA: never Severe hypoglycemia: never Pancreatitis: never. 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  He says glucose at work is similar to off days.    Interval history:  He takes these settings:  basal rate of 3 units/hr, 6 AM-10 PM, and 1.2 unit/hr, overnight. mealtime boluses varying from of 1 unit/ 6-8 grams carbohydrate (he did not adjust at last ov).  This results in mealtime boluses of 10-12 units per meal.   correction bolus (which some people call "sensitivity," or "insulin sensitivity ratio," or just "isr") of 1 unit for each by 25-30 which glucose exceeds 100.  continuous glucose monitor is downloaded today, and the printout is scanned into the record.  It varies from 80-300.  However, most are in the 200's.  pt states he feels well in general.  It is often mildly low in the late morning.  It is highest overnight.  He says he occasionally misses the synthroid.   Past Medical History:  Diagnosis Date  . Diabetes mellitus   . Goiter   . Gynecomastia   . Hypoglycemia associated with diabetes (HCC)   . Hypothyroidism, acquired, autoimmune   . Thyroiditis, autoimmune     No past surgical history on file.  Social History   Social History  . Marital status: Single    Spouse name: N/A  . Number of children: N/A  . Years of education: N/A   Occupational History  . Not on file.   Social History Main Topics  . Smoking status: Never Smoker  . Smokeless tobacco: Never Used  . Alcohol use No  . Drug use: No  . Sexual activity: Yes    Partners: Female   Other Topics Concern  . Not on file   Social History Narrative  . No narrative on file    Current Outpatient  Prescriptions on File Prior to Visit  Medication Sig Dispense Refill  . Continuous Blood Gluc Sensor (DEXCOM G5 MOB/G4 PLAT SENSOR) MISC CHANGE ONCE TO TWICE WEEKLY 96 each 0  . Continuous Blood Gluc Transmit (DEXCOM G5 MOBILE TRANSMITTER) MISC USE DAILY AS DIRECTED 4 each 0  . glucagon (GLUCAGON EMERGENCY) 1 MG injection Inject 1mg  intramuscular as needed for severe Hypoglecemia. Dispense 4= 30 days 4 each 4  . glucose blood (FREESTYLE LITE) test strip Check Bg 10x day 300 each 6  . Insulin Disposable Pump (OMNIPOD) MISC 1 Device by Does not apply route every 3 (three) days. 15 each 11  . insulin lispro (HUMALOG) 100 UNIT/ML injection Use up to 300 units in insulin pump 40 mL 5  . levothyroxine (SYNTHROID) 150 MCG tablet Take 1 tablet (150 mcg total) by mouth daily before breakfast. 30 tablet 4  . lisinopril (PRINIVIL,ZESTRIL) 5 MG tablet TAKE 1 TABLET (5 MG TOTAL) BY MOUTH DAILY. 30 tablet 3   No current facility-administered medications on file prior to visit.     No Known Allergies  Family History  Problem Relation Age of Onset  . Diabetes Mother   . Hypothyroidism Mother   . Cancer Mother   . Hypertension Mother   . Cancer Brother   . Hyperthyroidism Paternal Grandfather  BP 132/88   Pulse 76   Wt 230 lb 12.8 oz (104.7 kg)   SpO2 98%   BMI 31.30 kg/m   Review of Systems Denies LOC    Objective:   Physical Exam VITAL SIGNS:  See vs page GENERAL: no distress Pulses: foot pulses are intact bilaterally.   MSK: no deformity of the feet or ankles.  CV: no edema of the legs or ankles Skin:  no ulcer on the feet or ankles.  normal color and temp on the feet and ankles Neuro: sensation is intact to touch on the feet and ankles.    outside test results are reviewed: TSH=75  A1c=8.6%    Assessment & Plan:  Hypothyroidism: worse, usually due to noncompliance.  Type 1 DM: he needs increased rx.     Patient Instructions  check your blood sugar twice a day, to  calibrate your monitor.  vary the time of day when you check, between before the 3 meals, and at bedtime.  also check if you have symptoms of your blood sugar being too high or too low.  please keep a record of the readings and bring it to your next appointment here (or you can bring the meter itself).  You can write it on any piece of paper.  please call us sooner if your blood sugar goes below 70, or if you have a lot of readings over 200.  Please take the these settings:  basal rate of 3 units/hr, 11 AM-10 PM, and 1.8 units/hr, at all other times.  mealtime bolus of 2 units with breakfast and lunch, and 8 units with supper.  You can vary this by 1 unit each way, according to the size of his meal.  Also skip the breakfast bolus if you are going to be active.   correction bolus (which some people call "sensitivity," or "insulin sensitivity ratio," or just "isr") of 1 unit for each 50 by which your glucose exceeds 100.  Please come back for a follow-up appointment in 1 month.        Levothyroxine tablets What is this medicine? LEVOTHYROXINE (lee voe thye ROX een) is a thyroid hormone. This medicine can improve symptoms of thyroid deficiency such as slow speech, lack of energy, weight gain, hair loss, dry skin, and feeling cold. It also helps to treat goiter (an enlarged thyroid gland). It is also used to treat some kinds of thyroid cancer along with surgery and other medicines. This medicine may be used for other purposes; ask your health care provider or pharmacist if you have questions. COMMON BRAND NAME(S): Estre, Levo-T, Levothroid, Levoxyl, Synthroid, Thyro-Tabs, Unithroid What should I tell my health care provider before I take this medicine? They need to know if you have any of these conditions: -angina -blood clotting problems -diabetes -dieting or on a weight loss program -fertility problems -heart disease -high levels of thyroid hormone -pituitary gland problem -previous heart  attack -an unusual or allergic reaction to levothyroxine, thyroid hormones, other medicines, foods, dyes, or preservatives -pregnant or trying to get pregnant -breast-feeding How should I use this medicine? Take this medicine by mouth with plenty of water. It is best to take on an empty stomach, at least 30 minutes before or 2 hours after food. Follow the directions on the prescription label. Take at the same time each day. Do not take your medicine more often than directed. Contact your pediatrician regarding the use of this medicine in children. While this drug may be prescribed  for children and infants as young as a few days of age for selected conditions, precautions do apply. For infants, you may crush the tablet and place in a small amount of (5-10 ml or 1 to 2 teaspoonfuls) of water, breast milk, or non-soy based infant formula. Do not mix with soy-based infant formula. Give as directed. Overdosage: If you think you have taken too much of this medicine contact a poison control center or emergency room at once. NOTE: This medicine is only for you. Do not share this medicine with others. What if I miss a dose? If you miss a dose, take it as soon as you can. If it is almost time for your next dose, take only that dose. Do not take double or extra doses. What may interact with this medicine? -amiodarone -antacids -anti-thyroid medicines -calcium supplements -carbamazepine -cholestyramine -colestipol -digoxin -male hormones, including contraceptive or birth control pills -iron supplements -ketamine -liquid nutrition products like Ensure -medicines for colds and breathing difficulties -medicines for diabetes -medicines for mental depression -medicines or herbals used to decrease weight or appetite -phenobarbital or other barbiturate medications -phenytoin -prednisone or other corticosteroids -rifabutin -rifampin -soy isoflavones -sucralfate -theophylline -warfarin This list  may not describe all possible interactions. Give your health care provider a list of all the medicines, herbs, non-prescription drugs, or dietary supplements you use. Also tell them if you smoke, drink alcohol, or use illegal drugs. Some items may interact with your medicine. What should I watch for while using this medicine? Be sure to take this medicine with plenty of fluids. Some tablets may cause choking, gagging, or difficulty swallowing from the tablet getting stuck in your throat. Most of these problems disappear if the medicine is taken with the right amount of water or other fluids. Do not switch brands of this medicine unless your health care professional agrees with the change. Ask questions if you are uncertain. You will need regular exams and occasional blood tests to check the response to treatment. If you are receiving this medicine for an underactive thyroid, it may be several weeks before you notice an improvement. Check with your doctor or health care professional if your symptoms do not improve. It may be necessary for you to take this medicine for the rest of your life. Do not stop using this medicine unless your doctor or health care professional advises you to. This medicine can affect blood sugar levels. If you have diabetes, check your blood sugar as directed. You may lose some of your hair when you first start treatment. With time, this usually corrects itself. If you are going to have surgery, tell your doctor or health care professional that you are taking this medicine. What side effects may I notice from receiving this medicine? Side effects that you should report to your doctor or health care professional as soon as possible: -allergic reactions like skin rash, itching or hives, swelling of the face, lips, or tongue -chest pain -excessive sweating or intolerance to heat -fast or irregular heartbeat -nervousness -skin rash or hives -swelling of ankles, feet, or  legs -tremors Side effects that usually do not require medical attention (report to your doctor or health care professional if they continue or are bothersome): -changes in appetite -changes in menstrual periods -diarrhea -hair loss -headache -trouble sleeping -weight loss This list may not describe all possible side effects. Call your doctor for medical advice about side effects. You may report side effects to FDA at 1-800-FDA-1088. Where should I keep my  medicine? Keep out of the reach of children. Store at room temperature between 15 and 30 degrees C (59 and 86 degrees F). Protect from light and moisture. Keep container tightly closed. Throw away any unused medicine after the expiration date. NOTE: This sheet is a summary. It may not cover all possible information. If you have questions about this medicine, talk to your doctor, pharmacist, or health care provider.  2018 [employer]/Gold Standard (2008-06-01 14:28:07)

## 2016-10-23 NOTE — Patient Instructions (Addendum)
check your blood sugar twice a day, to calibrate your monitor.  vary the time of day when you check, between before the 3 meals, and at bedtime.  also check if you have symptoms of your blood sugar being too high or too low.  please keep a record of the readings and bring it to your next appointment here (or you can bring the meter itself).  You can write it on any piece of paper.  please call us sooner if your blood sugar goes below 70, or if you have a lot of readings over 200.  Please take the these settings:  basal rate of 3 units/hr, 11 AM-10 PM, and 1.8 units/hr, at all other times.  mealtime bolus of 2 units with breakfast and lunch, and 8 units with supper.  You can vary this by 1 unit each way, according to the size of his meal.  Also skip the breakfast bolus if you are going to be active.   correction bolus (which some people call "sensitivity," or "insulin sensitivity ratio," or just "isr") of 1 unit for each 50 by which your glucose exceeds 100.  Please come back for a follow-up appointment in 1 month.        Levothyroxine tablets What is this medicine? LEVOTHYROXINE (lee voe thye ROX een) is a thyroid hormone. This medicine can improve symptoms of thyroid deficiency such as slow speech, lack of energy, weight gain, hair loss, dry skin, and feeling cold. It also helps to treat goiter (an enlarged thyroid gland). It is also used to treat some kinds of thyroid cancer along with surgery and other medicines. This medicine may be used for other purposes; ask your health care provider or pharmacist if you have questions. COMMON BRAND NAME(S): Estre, Levo-T, Levothroid, Levoxyl, Synthroid, Thyro-Tabs, Unithroid What should I tell my health care provider before I take this medicine? They need to know if you have any of these conditions: -angina -blood clotting problems -diabetes -dieting or on a weight loss program -fertility problems -heart disease -high levels of thyroid  hormone -pituitary gland problem -previous heart attack -an unusual or allergic reaction to levothyroxine, thyroid hormones, other medicines, foods, dyes, or preservatives -pregnant or trying to get pregnant -breast-feeding How should I use this medicine? Take this medicine by mouth with plenty of water. It is best to take on an empty stomach, at least 30 minutes before or 2 hours after food. Follow the directions on the prescription label. Take at the same time each day. Do not take your medicine more often than directed. Contact your pediatrician regarding the use of this medicine in children. While this drug may be prescribed for children and infants as young as a few days of age for selected conditions, precautions do apply. For infants, you may crush the tablet and place in a small amount of (5-10 ml or 1 to 2 teaspoonfuls) of water, breast milk, or non-soy based infant formula. Do not mix with soy-based infant formula. Give as directed. Overdosage: If you think you have taken too much of this medicine contact a poison control center or emergency room at once. NOTE: This medicine is only for you. Do not share this medicine with others. What if I miss a dose? If you miss a dose, take it as soon as you can. If it is almost time for your next dose, take only that dose. Do not take double or extra doses. What may interact with this medicine? -amiodarone -antacids -anti-thyroid medicines -calcium  supplements -carbamazepine -cholestyramine -colestipol -digoxin -male hormones, including contraceptive or birth control pills -iron supplements -ketamine -liquid nutrition products like Ensure -medicines for colds and breathing difficulties -medicines for diabetes -medicines for mental depression -medicines or herbals used to decrease weight or appetite -phenobarbital or other barbiturate medications -phenytoin -prednisone or other corticosteroids -rifabutin -rifampin -soy  isoflavones -sucralfate -theophylline -warfarin This list may not describe all possible interactions. Give your health care provider a list of all the medicines, herbs, non-prescription drugs, or dietary supplements you use. Also tell them if you smoke, drink alcohol, or use illegal drugs. Some items may interact with your medicine. What should I watch for while using this medicine? Be sure to take this medicine with plenty of fluids. Some tablets may cause choking, gagging, or difficulty swallowing from the tablet getting stuck in your throat. Most of these problems disappear if the medicine is taken with the right amount of water or other fluids. Do not switch brands of this medicine unless your health care professional agrees with the change. Ask questions if you are uncertain. You will need regular exams and occasional blood tests to check the response to treatment. If you are receiving this medicine for an underactive thyroid, it may be several weeks before you notice an improvement. Check with your doctor or health care professional if your symptoms do not improve. It may be necessary for you to take this medicine for the rest of your life. Do not stop using this medicine unless your doctor or health care professional advises you to. This medicine can affect blood sugar levels. If you have diabetes, check your blood sugar as directed. You may lose some of your hair when you first start treatment. With time, this usually corrects itself. If you are going to have surgery, tell your doctor or health care professional that you are taking this medicine. What side effects may I notice from receiving this medicine? Side effects that you should report to your doctor or health care professional as soon as possible: -allergic reactions like skin rash, itching or hives, swelling of the face, lips, or tongue -chest pain -excessive sweating or intolerance to heat -fast or irregular  heartbeat -nervousness -skin rash or hives -swelling of ankles, feet, or legs -tremors Side effects that usually do not require medical attention (report to your doctor or health care professional if they continue or are bothersome): -changes in appetite -changes in menstrual periods -diarrhea -hair loss -headache -trouble sleeping -weight loss This list may not describe all possible side effects. Call your doctor for medical advice about side effects. You may report side effects to FDA at 1-800-FDA-1088. Where should I keep my medicine? Keep out of the reach of children. Store at room temperature between 15 and 30 degrees C (59 and 86 degrees F). Protect from light and moisture. Keep container tightly closed. Throw away any unused medicine after the expiration date. NOTE: This sheet is a summary. It may not cover all possible information. If you have questions about this medicine, talk to your doctor, pharmacist, or health care provider.  2018 Elsevier/Gold Standard (2008-06-01 14:28:07)

## 2016-10-29 ENCOUNTER — Telehealth: Payer: Self-pay | Admitting: Endocrinology

## 2016-10-29 NOTE — Telephone Encounter (Signed)
Patient is requesting that you give him a call. He states that he needs some information for paperwork at his job.

## 2016-10-30 NOTE — Telephone Encounter (Signed)
Called patient back left VM to call back

## 2016-11-23 ENCOUNTER — Ambulatory Visit: Payer: 59 | Admitting: Endocrinology

## 2016-11-24 ENCOUNTER — Telehealth: Payer: Self-pay | Admitting: Endocrinology

## 2016-11-24 NOTE — Telephone Encounter (Signed)
Patient needs a call regarding a PA for Continuous Blood Gluc Sensor (DEXCOM G5 MOB/G4 PLAT SENSOR) MISC and the Insulin Disposable Pump (OMNIPOD) MISC. Patient needs the Insulin Disposable Pump (OMNIPOD) MISC adjusted to every 48 hours. Both medications needs a PA. Call patient as soon as possible, he is out of the medications.   Pharmacy for the Sensors:  [facility] - [place], [place] - [address] [place] [phone] (Phone) [phone] (Fax)   Pharmacy for the [place] (phone: [phone]) : [facility]  Call to advise before the end of the day.

## 2016-11-25 ENCOUNTER — Other Ambulatory Visit (INDEPENDENT_AMBULATORY_CARE_PROVIDER_SITE_OTHER): Payer: Self-pay | Admitting: Pediatric Endocrinology

## 2016-11-25 NOTE — Telephone Encounter (Signed)
Called patient to ask if they had back up until we can find out what is needed for PA.

## 2016-11-27 NOTE — Telephone Encounter (Signed)
Called patient & again left VM for him to call back. I was concerned because message said that he was out of medications.

## 2016-12-14 ENCOUNTER — Encounter: Payer: Self-pay | Admitting: Endocrinology

## 2016-12-14 ENCOUNTER — Ambulatory Visit (INDEPENDENT_AMBULATORY_CARE_PROVIDER_SITE_OTHER): Payer: 59 | Admitting: Endocrinology

## 2016-12-14 ENCOUNTER — Other Ambulatory Visit: Payer: Self-pay

## 2016-12-14 VITALS — BP 122/82 | HR 76 | Wt 229.0 lb

## 2016-12-14 DIAGNOSIS — E10649 Type 1 diabetes mellitus with hypoglycemia without coma: Secondary | ICD-10-CM | POA: Diagnosis not present

## 2016-12-14 DIAGNOSIS — IMO0001 Reserved for inherently not codable concepts without codable children: Secondary | ICD-10-CM

## 2016-12-14 DIAGNOSIS — E1065 Type 1 diabetes mellitus with hyperglycemia: Secondary | ICD-10-CM

## 2016-12-14 DIAGNOSIS — E039 Hypothyroidism, unspecified: Secondary | ICD-10-CM

## 2016-12-14 LAB — POCT GLYCOSYLATED HEMOGLOBIN (HGB A1C): Hemoglobin A1C: 8

## 2016-12-14 MED ORDER — LISINOPRIL 5 MG PO TABS: 5.0000 mg | ORAL_TABLET | Freq: Every day | ORAL | 3 refills | Status: DC

## 2016-12-14 MED ORDER — INSULIN LISPRO 100 UNIT/ML ~~LOC~~ SOLN: SUBCUTANEOUS | 5 refills | Status: DC

## 2016-12-14 MED ORDER — DEXCOM G5 MOBILE TRANSMITTER MISC: 2.0000 | 0 refills | Status: DC

## 2016-12-14 MED ORDER — OMNIPOD MISC: 1.0000 | 3 refills | Status: DC

## 2016-12-14 MED ORDER — LEVOTHYROXINE SODIUM 150 MCG PO TABS: 150.0000 ug | ORAL_TABLET | Freq: Every day | ORAL | 4 refills | Status: DC

## 2016-12-14 MED ORDER — DEXCOM G5 MOB/G4 PLAT SENSOR MISC: 2.0000 | 0 refills | Status: DC

## 2016-12-14 NOTE — Patient Instructions (Addendum)
check your blood sugar twice a day, to calibrate your monitor.  vary the time of day when you check, between before the 3 meals, and at bedtime.  also check if you have symptoms of your blood sugar being too high or too low.  please keep a record of the readings and bring it to your next appointment here (or you can bring the meter itself).  You can write it on any piece of paper.  please call us sooner if your blood sugar goes below 70, or if you have a lot of readings over 200.  Please take the these settings:  basal rate of 3 units/hr, noon-10 PM, and 1.8 units/hr, at all other times.  correction bolus (which some people call "sensitivity," or "insulin sensitivity ratio," or just "isr") of 1 unit for each 50 by which your glucose exceeds 100.  It is best to never miss the levothyroxine.  However, next best is to take 2 the next day if you miss a day.   Please come back for a follow-up appointment in 2 months.

## 2016-12-14 NOTE — Progress Notes (Signed)
Subjective:    Patient ID: David [name], male    DOB: 1995-12-21, 21 y.o.   MRN: [id number]  HPI Pt returns for f/u of diabetes mellitus: DM type: 1 Dx'ed: 2007 Complications: none Therapy: insulin since dx  DKA: never Severe hypoglycemia: never Pancreatitis: never. 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  He says glucose at work is similar to off days.    Interval history:  He takes these settings:  basal rate of 3 units/hr, noon-10 PM 1.5 units/HR, 10PM-MN 1.8 units/HR, MN-6 AM 2.5 units/HR, 6 AM-noon: (these basal settings are different from what was rx'ed).  mealtime bolus of 2 units with breakfast and lunch, and 8 units with supper.  You can vary this by 1 unit each way, according to the size of his meal.  Also skip the breakfast bolus if you are going to be active correction bolus (which some people call "sensitivity," or "insulin sensitivity ratio," or just "isr") of 1 unit for each by 50 which glucose exceeds 100. 2 weeks ago, he ran out of omnipods x 10 days, so he took injections then.  He is also out of sensors.  He says he needs a new omnipod every 2 days.   no cbg record, but states cbg's vary from 50-200's.  It is lowest at lunch, and highest in the afternoon.  He says he often forgets to take a lunch bolus.   He has been out of synthroid x 1 week.   Past Medical History:  Diagnosis Date  . Diabetes mellitus   . Goiter   . Gynecomastia   . Hypoglycemia associated with diabetes (HCC)   . Hypothyroidism, acquired, autoimmune   . Thyroiditis, autoimmune     No past surgical history on file.  Social History   Social History  . Marital status: Single    Spouse name: N/A  . Number of children: N/A  . Years of education: N/A   Occupational History  . Not on file.   Social History Main Topics  . Smoking status: Never Smoker  . Smokeless tobacco: Never Used  .  Alcohol use No  . Drug use: No  . Sexual activity: Yes    Partners: Female   Other Topics Concern  . Not on file   Social History Narrative  . No narrative on file    Current Outpatient Prescriptions on File Prior to Visit  Medication Sig Dispense Refill  . glucagon (GLUCAGON EMERGENCY) 1 MG injection Inject  intramuscular as needed for severe Hypoglecemia. Dispense 4= 30 days 4 each 4  . glucose blood (FREESTYLE LITE) test strip Check Bg 10x day 300 each 6   No current facility-administered medications on file prior to visit.     No Known Allergies  Family History  Problem Relation Age of Onset  . Diabetes Mother   . Hypothyroidism Mother   . Cancer Mother   . Hypertension Mother   . Cancer Brother   . Hyperthyroidism Paternal Grandfather     BP 122/82   Pulse 76   Wt 229 lb (103.9 kg)   SpO2 96%   BMI 31.06 kg/m   Review of Systems Denies LOC    Objective:   Physical Exam VITAL SIGNS:  See vs page GENERAL: no distress Pulses: foot pulses are intact bilaterally.   MSK: no deformity of the feet or ankles.  CV: no edema  of the legs or ankles Skin:  no ulcer on the feet or ankles.  normal color and temp on the feet and ankles Neuro: sensation is intact to touch on the feet and ankles.    A1c=8.0%     Assessment & Plan:  Type 1 DM: she needs increased rx.  Noncompliance with cbg recording and insulin dosing: worse.   Hypothyroidism: therapy limited by noncompliance.     Patient Instructions  check your blood sugar twice a day, to calibrate your monitor.  vary the time of day when you check, between before the 3 meals, and at bedtime.  also check if you have symptoms of your blood sugar being too high or too low.  please keep a record of the readings and bring it to your next appointment here (or you can bring the meter itself).  You can write it on any piece of paper.  please call us sooner if your blood sugar goes below 70, or if you have a lot of  readings over 200.  Please take the these settings:  basal rate of 3 units/hr, noon-10 PM, and 1.8 units/hr, at all other times.  correction bolus (which some people call "sensitivity," or "insulin sensitivity ratio," or just "isr") of 1 unit for each 50 by which your glucose exceeds 100.  It is best to never miss the levothyroxine.  However, next best is to take 2 the next day if you miss a day.   Please come back for a follow-up appointment in 2 months.

## 2016-12-22 ENCOUNTER — Telehealth: Payer: Self-pay | Admitting: Endocrinology

## 2016-12-22 NOTE — Telephone Encounter (Signed)
Shelby w/[facility] calling to confirm receipt of a fax she sent for prescription for Omnipod. Please call [phone] to confirm

## 2016-12-22 NOTE — Telephone Encounter (Signed)
[facility] & I noticed her that I had paperwork for Dr. Everardo All to sign. I will fax back when completed.

## 2017-01-06 NOTE — Telephone Encounter (Signed)
error 

## 2017-01-09 ENCOUNTER — Other Ambulatory Visit (INDEPENDENT_AMBULATORY_CARE_PROVIDER_SITE_OTHER): Payer: Self-pay | Admitting: "Endocrinology

## 2017-01-09 DIAGNOSIS — IMO0001 Reserved for inherently not codable concepts without codable children: Secondary | ICD-10-CM

## 2017-01-09 DIAGNOSIS — E1065 Type 1 diabetes mellitus with hyperglycemia: Principal | ICD-10-CM

## 2017-02-15 ENCOUNTER — Ambulatory Visit: Payer: 59 | Admitting: Endocrinology

## 2017-02-19 ENCOUNTER — Telehealth: Payer: Self-pay

## 2017-02-19 NOTE — Telephone Encounter (Signed)
Left vm requesting patient call back so that we can reschedule his new patient appointment that was canceled on 12/10 due to the weather

## 2017-04-15 ENCOUNTER — Ambulatory Visit (INDEPENDENT_AMBULATORY_CARE_PROVIDER_SITE_OTHER): Payer: 59 | Admitting: Endocrinology

## 2017-04-15 ENCOUNTER — Encounter: Payer: Self-pay | Admitting: Endocrinology

## 2017-04-15 VITALS — BP 138/82 | HR 59 | Wt 241.8 lb

## 2017-04-15 DIAGNOSIS — E10649 Type 1 diabetes mellitus with hypoglycemia without coma: Secondary | ICD-10-CM

## 2017-04-15 DIAGNOSIS — E063 Autoimmune thyroiditis: Secondary | ICD-10-CM | POA: Diagnosis not present

## 2017-04-15 LAB — POCT GLYCOSYLATED HEMOGLOBIN (HGB A1C): Hemoglobin A1C: 9

## 2017-04-15 LAB — TSH: TSH: 31.62 u[IU]/mL — ABNORMAL HIGH (ref 0.35–4.50)

## 2017-04-15 MED ORDER — LEVOTHYROXINE SODIUM 175 MCG PO TABS: 175.0000 ug | ORAL_TABLET | Freq: Every day | ORAL | 3 refills | Status: DC

## 2017-04-15 NOTE — Patient Instructions (Addendum)
check your blood sugar twice a day, to calibrate your monitor.  vary the time of day when you check, between before the 3 meals, and at bedtime.  also check if you have symptoms of your blood sugar being too high or too low.  please keep a record of the readings and bring it to your next appointment here (or you can bring the meter itself).  You can write it on any piece of paper.  please call us sooner if your blood sugar goes below 70, or if you have a lot of readings over 200.  Please take the these settings:  basal rate of 3.5 units/hr, noon-10 PM, and 1.6 units/hr, at all other times.  correction bolus (which some people call "sensitivity," or "insulin sensitivity ratio," or just "isr") of 1 unit for each 50 by which your glucose exceeds 100.  blood tests are requested for you today.  We'll let you know about the results.   Please come back for a follow-up appointment in 2 months.

## 2017-04-15 NOTE — Progress Notes (Signed)
Subjective:    Patient ID: David [name], male    DOB: 08/17/1995, 22 y.o.   MRN: [id number]  HPI Pt returns for f/u of diabetes mellitus: DM type: 1 Dx'ed: 2007 Complications: none Therapy: insulin since dx  DKA: never Severe hypoglycemia: never Pancreatitis: never. 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  He says glucose at work is similar to off days.    Interval history:  He takes these settings:  basal rate of 3 units/hr, noon-10 PM, and 1.8 units/hr, at all other times.  No mealtime bolus.  correction bolus (which some people call "sensitivity," or "insulin sensitivity ratio," or just "isr") of 1 unit for each 50 by which your glucose exceeds 100. Meter is downloaded today, and the printout is scanned into the record. cbg's vary from 36-400's.  It is still lowest fasting and at lunch, and highest in the afternoon. He seldom has hypoglycemia, and these episodes are mild. This usually happens before lunch.  pt states he feels well in general, except for fatigue.  He says he never misses the synthroid.  Past Medical History:  Diagnosis Date  . Diabetes mellitus   . Goiter   . Gynecomastia   . Hypoglycemia associated with diabetes (HCC)   . Hypothyroidism, acquired, autoimmune   . Thyroiditis, autoimmune     History reviewed. No pertinent surgical history.  Social History   Socioeconomic History  . Marital status: Single    Spouse name: Not on file  . Number of children: Not on file  . Years of education: Not on file  . Highest education level: Not on file  Social Needs  . Financial resource strain: Not on file  . Food insecurity - worry: Not on file  . Food insecurity - inability: Not on file  . Transportation needs - medical: Not on file  . Transportation needs - non-medical: Not on file  Occupational History  . Not on file  Tobacco Use  . Smoking status: Never Smoker    . Smokeless tobacco: Never Used  Substance and Sexual Activity  . Alcohol use: No  . Drug use: No  . Sexual activity: Yes    Partners: Female  Other Topics Concern  . Not on file  Social History Narrative  . Not on file    Current Outpatient Medications on File Prior to Visit  Medication Sig Dispense Refill  . Continuous Blood Gluc Transmit (DEXCOM G5 MOBILE TRANSMITTER) MISC 2 each by Does not apply route See admin instructions. 4 each 0  . glucagon (GLUCAGON EMERGENCY) 1 MG injection Inject 1mg  intramuscular as needed for severe Hypoglecemia. Dispense 4= 30 days 4 each 4  . glucose blood (FREESTYLE LITE) test strip Check Bg 10x day 300 each 6  . Insulin Disposable Pump (OMNIPOD) MISC 1 Device by Does not apply route every other day. 45 each 3  . insulin lispro (HUMALOG) 100 UNIT/ML injection Use up to 300 units in insulin pump 40 mL 5  . lisinopril (PRINIVIL,ZESTRIL) 5 MG tablet Take 1 tablet (5 mg total) by mouth daily. 30 tablet 3   No current facility-administered medications on file prior to visit.     No Known Allergies  Family History  Problem Relation Age of Onset  . Diabetes Mother   . Hypothyroidism Mother   . Cancer Mother   . Hypertension Mother   . Cancer Brother   .  Hyperthyroidism Paternal Grandfather     BP 138/82 (BP Location: Left Arm, Patient Position: Sitting, Cuff Size: Normal)   Pulse (!) 59   Wt 241 lb 12.8 oz (109.7 kg)   SpO2 98%   BMI 32.79 kg/m   Review of Systems Denies LOC.     Objective:   Physical Exam VITAL SIGNS:  See vs page GENERAL: no distress Pulses: dorsalis pedis intact bilat.   MSK: no deformity of the feet CV: no leg edema Skin:  no ulcer on the feet.  normal color and temp on the feet. Neuro: sensation is intact to touch on the feet   A1c=9.0%  Lab Results  Component Value Date   TSH 31.62 (H) 04/15/2017   T4TOTAL 7.2 01/01/2016      Assessment & Plan:  Type 1 DM: worse Hypothyroidism, persistent despite  supplementation: poss due to noncompliance.  This raises the question of compliance with insulin, also.   Patient Instructions  check your blood sugar twice a day, to calibrate your monitor.  vary the time of day when you check, between before the 3 meals, and at bedtime.  also check if you have symptoms of your blood sugar being too high or too low.  please keep a record of the readings and bring it to your next appointment here (or you can bring the meter itself).  You can write it on any piece of paper.  please call us sooner if your blood sugar goes below 70, or if you have a lot of readings over 200.  Please take the these settings:  basal rate of 3.5 units/hr, noon-10 PM, and 1.6 units/hr, at all other times.  correction bolus (which some people call "sensitivity," or "insulin sensitivity ratio," or just "isr") of 1 unit for each 50 by which your glucose exceeds 100.  blood tests are requested for you today.  We'll let you know about the results.   Please come back for a follow-up appointment in 2 months.

## 2017-05-28 ENCOUNTER — Other Ambulatory Visit: Payer: Self-pay | Admitting: Endocrinology

## 2017-05-31 ENCOUNTER — Ambulatory Visit (HOSPITAL_COMMUNITY)
Admission: EM | Admit: 2017-05-31 | Discharge: 2017-05-31 | Disposition: A | Discharge status: Home / Self Care | Payer: 59 | Attending: Emergency Medicine | Admitting: Emergency Medicine

## 2017-05-31 ENCOUNTER — Encounter (HOSPITAL_COMMUNITY): Payer: Self-pay | Admitting: Emergency Medicine

## 2017-05-31 ENCOUNTER — Other Ambulatory Visit: Payer: Self-pay

## 2017-05-31 DIAGNOSIS — J029 Acute pharyngitis, unspecified: Secondary | ICD-10-CM | POA: Diagnosis not present

## 2017-05-31 MED ORDER — OMEPRAZOLE 20 MG PO CPDR: 20.0000 mg | DELAYED_RELEASE_CAPSULE | Freq: Every day | ORAL | 0 refills | Status: DC

## 2017-05-31 NOTE — ED Triage Notes (Signed)
C/o sore throat onset 2 weeks, denies cough or rhinitis

## 2017-05-31 NOTE — Discharge Instructions (Signed)
We will try treatment for reflux to see if this is helpful with your sore throat symptoms. Continue to drink plenty of fluids to keep secretions thin, see provided education about GERD. May try allergy treatment as well as post nasal drip may be contributing to your symptoms. If no improvement or if worsening of symptoms please return or follow up with your primary care provider for additional evaluation.

## 2017-05-31 NOTE — ED Provider Notes (Signed)
[facility]    CSN: [id number] Arrival date & time: 05/31/17  1654     History   Chief Complaint Chief Complaint  Patient presents with  . Sore Throat    HPI David [name] is a 22 y.o. male.   David presents with complaints of sore throat which started approximately 1.5- 2 weeks ago. Pain with swallowing. States he feels like below his ears feels pain but without ear pain. No fevers. Denies cough or congestion, without runny nose. 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 Has taken nyquil a few times which has helped him sleep but has not helped with symptoms. Denies gi/gu complaints. Pain is worse with laying flat. Feels like his tongue feels affected in his throat. History of goiter, DMt1, htn.    ROS per HPI.      Past Medical History:  Diagnosis Date  . Diabetes mellitus   . Goiter   . Gynecomastia   . Hypoglycemia associated with diabetes (HCC)   . Hypothyroidism, acquired, autoimmune   . Thyroiditis, autoimmune     Patient Active Problem List   Diagnosis Date Noted  . Hypoglycemia unawareness in type 1 diabetes mellitus (HCC) 07/23/2015  . Maladaptive health behaviors affecting medical condition 04/24/2015  . Adjustment reaction of adult life 01/23/2015  . Adjustment reaction to medical therapy 11/08/2014  . Poor sleep pattern 02/21/2013  . Essential hypertension, benign 08/16/2012  . Diabetic peripheral neuropathy (HCC) 08/16/2012  . Hypoglycemia associated with diabetes (HCC) 07/18/2010  . Goiter   . Hypothyroidism, acquired, autoimmune   . Thyroiditis, autoimmune   . Uncontrolled type 1 diabetes mellitus (HCC) 06/17/2010  . CHRONIC RHINITIS 06/11/2008  . DYSPNEA 06/11/2008    History reviewed. No pertinent surgical history.     Home Medications    Prior to Admission medications   Medication Sig Start Date End Date Taking? Authorizing Provider  Continuous Blood Gluc Transmit (DEXCOM G5 MOBILE TRANSMITTER) MISC 2 each  by Does not apply route See admin instructions. 12/14/16   Romero Belling, MD  glucagon (GLUCAGON EMERGENCY) 1 MG injection Inject 1mg  intramuscular as needed for severe Hypoglecemia. Dispense 4= 30 days 08/22/12   David Stall, MD  glucose blood (FREESTYLE LITE) test strip Check Bg 10x day 08/21/15   Gretchen Short, NP  Insulin Disposable Pump (OMNIPOD) MISC 1 Device by Does not apply route every other day. 12/14/16   Romero Belling, MD  insulin lispro (HUMALOG) 100 UNIT/ML injection Use up to 300 units in insulin pump 12/14/16   Romero Belling, MD  levothyroxine (SYNTHROID, LEVOTHROID) 175 MCG tablet Take 1 tablet (175 mcg total) by mouth daily before breakfast. 04/15/17   Romero Belling, MD  lisinopril (PRINIVIL,ZESTRIL) 5 MG tablet TAKE 1 TABLET BY MOUTH EVERY DAY 05/28/17   Romero Belling, MD  omeprazole (PRILOSEC) 20 MG capsule Take 1 capsule (20 mg total) by mouth daily. 05/31/17   Georgetta Haber, NP    Family History Family History  Problem Relation Age of Onset  . Diabetes Mother   . Hypothyroidism Mother   . Cancer Mother   . Hypertension Mother   . Cancer Brother   . Hyperthyroidism Paternal Grandfather     Social History Social History   Tobacco Use  . Smoking status: Never Smoker  . Smokeless tobacco: Never Used  Substance Use Topics  . Alcohol use: No  . Drug use: No     Allergies   Patient has no known allergies.  Review of Systems Review of Systems   Physical Exam Triage Vital Signs ED Triage Vitals  Enc Vitals Group     BP 05/31/17 1742 (!) 111/57     Pulse Rate 05/31/17 1742 68     Resp --      Temp 05/31/17 1742 98.5 F (36.9 C)     Temp Source 05/31/17 1742 Oral     SpO2 05/31/17 1742 99 %     Weight --      Height --      Head Circumference --      Peak Flow --      Pain Score 05/31/17 1740 6     Pain Loc --      Pain Edu? --      Excl. in GC? --    No data found.  Updated Vital Signs BP (!) 111/57 (BP Location: Left Arm)   Pulse 68    Temp 98.5 F (36.9 C) (Oral)   SpO2 99%   Visual Acuity Right Eye Distance:   Left Eye Distance:   Bilateral Distance:    Right Eye Near:   Left Eye Near:    Bilateral Near:     Physical Exam  Constitutional: He is oriented to person, place, and time. He appears well-developed and well-nourished.  HENT:  Head: Normocephalic and atraumatic.  Right Ear: Tympanic membrane, external ear and ear canal normal.  Left Ear: Tympanic membrane, external ear and ear canal normal.  Nose: Nose normal. Right sinus exhibits no maxillary sinus tenderness and no frontal sinus tenderness. Left sinus exhibits no maxillary sinus tenderness and no frontal sinus tenderness.  Mouth/Throat: Uvula is midline, oropharynx is clear and moist and mucous membranes are normal. No posterior oropharyngeal edema, posterior oropharyngeal erythema or tonsillar abscesses. Tonsils are 1+ on the right. Tonsils are 1+ on the left. No tonsillar exudate.  Eyes: Pupils are equal, round, and reactive to light. Conjunctivae are normal.  Neck: Normal range of motion.  Cardiovascular: Normal rate and regular rhythm.  Pulmonary/Chest: Effort normal and breath sounds normal.  Lymphadenopathy:    He has no cervical adenopathy.  Neurological: He is alert and oriented to person, place, and time.  Skin: Skin is warm and dry.  Vitals reviewed.    UC Treatments / Results  Labs (all labs ordered are listed, but only abnormal results are displayed) Labs Reviewed - No data to display  EKG None Radiology No results found.  Procedures Procedures (including critical care time)  Medications Ordered in UC Medications - No data to display   Initial Impression / Assessment and Plan / UC Course  I have reviewed the triage vital signs and the nursing notes.  Pertinent labs & imaging results that were available during my care of the patient were reviewed by me and considered in my medical decision making (see chart for  details).     Without acute findings on exam. Discussed with patient gerd vs post nasal drip vs viral pharyngitis. Denies  Any congestion symptoms. Will try omeprazole at this time to see if symptoms improve. Encouraged follow up with PCP for recheck. Patient verbalized understanding and agreeable to plan.    Final Clinical Impressions(s) / UC Diagnoses   Final diagnoses:  Pharyngitis, unspecified etiology    ED Discharge Orders        Ordered    omeprazole (PRILOSEC) 20 MG capsule  Daily     05/31/17 1848       Controlled Substance Prescriptions [place]  Controlled Substance Registry consulted? Not Applicable   Georgetta HaberBurky, Natalie B, NP 05/31/17 1857

## 2017-06-09 ENCOUNTER — Encounter: Payer: Self-pay | Admitting: Endocrinology

## 2017-06-09 ENCOUNTER — Other Ambulatory Visit (INDEPENDENT_AMBULATORY_CARE_PROVIDER_SITE_OTHER): Payer: 59

## 2017-06-09 ENCOUNTER — Ambulatory Visit (INDEPENDENT_AMBULATORY_CARE_PROVIDER_SITE_OTHER): Payer: 59 | Admitting: Endocrinology

## 2017-06-09 VITALS — BP 110/80 | HR 71 | Temp 97.7°F | Ht 72.0 in | Wt 228.2 lb

## 2017-06-09 DIAGNOSIS — IMO0001 Reserved for inherently not codable concepts without codable children: Secondary | ICD-10-CM

## 2017-06-09 DIAGNOSIS — E063 Autoimmune thyroiditis: Secondary | ICD-10-CM

## 2017-06-09 DIAGNOSIS — E1065 Type 1 diabetes mellitus with hyperglycemia: Secondary | ICD-10-CM | POA: Diagnosis not present

## 2017-06-09 LAB — POCT GLYCOSYLATED HEMOGLOBIN (HGB A1C): Hemoglobin A1C: 8.7

## 2017-06-09 LAB — T4, FREE: Free T4: 0.97 ng/dL (ref 0.60–1.60)

## 2017-06-09 LAB — TSH: TSH: 3.28 u[IU]/mL (ref 0.35–4.50)

## 2017-06-09 MED ORDER — DEXCOM G5 MOBILE TRANSMITTER MISC: 2.0000 | 1 refills | Status: DC

## 2017-06-09 MED ORDER — DEXCOM G5 MOB/G4 PLAT SENSOR MISC: 1.0000 | 3 refills | Status: DC

## 2017-06-09 MED ORDER — GLUCOSE BLOOD VI STRP: ORAL_STRIP | 6 refills | Status: DC

## 2017-06-09 NOTE — Patient Instructions (Addendum)
check your blood sugar twice a day, to calibrate your monitor.  vary the time of day when you check, between before the 3 meals, and at bedtime.  also check if you have symptoms of your blood sugar being too high or too low.  please keep a record of the readings and bring it to your next appointment here (or you can bring the meter itself).  You can write it on any piece of paper.  please call us sooner if your blood sugar goes below 70, or if you have a lot of readings over 200.  Please take the these settings:  basal rate of 4 units/hr, noon-10 PM, and 1.6 units/hr, at all other times.  correction bolus (which some people call "sensitivity," or "insulin sensitivity ratio," or just "isr") of 1 unit for each 50 by which your glucose exceeds 100.   blood tests are requested for you today.  We'll let you know about the results.   Please come back for a follow-up appointment in 2 months.

## 2017-06-09 NOTE — Progress Notes (Signed)
Subjective:    Patient ID: David [name], male    DOB: April 19, 1995, 22 y.o.   MRN: [id number]  HPI Pt returns for f/u of diabetes mellitus: DM type: 1 Dx'ed: 2007 Complications: none Therapy: insulin since dx  DKA: never Severe hypoglycemia: never Pancreatitis: never. 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  He says glucose at work is similar to off days.    Interval history:  He takes these settings:  basal rate of 3.5 units/hr, noon-10 PM, and 1.6 units/hr, at all other times.  No mealtime bolus.  correction bolus (which some people call "sensitivity," or "insulin sensitivity ratio," or just "isr") of 1 unit for each 50 by which your glucose exceeds 100. no cbg record, but states cbg's are highest in the afternoon pt states he feels well in general.  He says he never misses the synthroid.   Past Medical History:  Diagnosis Date  . Diabetes mellitus   . Goiter   . Gynecomastia   . Hypoglycemia associated with diabetes (HCC)   . Hypothyroidism, acquired, autoimmune   . Thyroiditis, autoimmune     No past surgical history on file.  Social History   Socioeconomic History  . Marital status: Single    Spouse name: Not on file  . Number of children: Not on file  . Years of education: Not on file  . Highest education level: Not on file  Occupational History  . Not on file  Social Needs  . Financial resource strain: Not on file  . Food insecurity:    Worry: Not on file    Inability: Not on file  . Transportation needs:    Medical: Not on file    Non-medical: Not on file  Tobacco Use  . Smoking status: Never Smoker  . Smokeless tobacco: Never Used  Substance and Sexual Activity  . Alcohol use: No  . Drug use: No  . Sexual activity: Yes    Partners: Female  Lifestyle  . Physical activity:    Days per week: Not on file    Minutes per session: Not on file  . Stress: Not on  file  Relationships  . Social connections:    Talks on phone: Not on file    Gets together: Not on file    Attends religious service: Not on file    Active member of club or organization: Not on file    Attends meetings of clubs or organizations: Not on file    Relationship status: Not on file  . Intimate partner violence:    Fear of current or ex partner: Not on file    Emotionally abused: Not on file    Physically abused: Not on file    Forced sexual activity: Not on file  Other Topics Concern  . Not on file  Social History Narrative  . Not on file    Current Outpatient Medications on File Prior to Visit  Medication Sig Dispense Refill  . glucagon (GLUCAGON EMERGENCY) 1 MG injection Inject 1mg  intramuscular as needed for severe Hypoglecemia. Dispense 4= 30 days 4 each 4  . Insulin Disposable Pump (OMNIPOD) MISC 1 Device by Does not apply route every other day. 45 each 3  . insulin lispro (HUMALOG) 100 UNIT/ML injection Use up to 300 units in insulin pump 40 mL 5  . levothyroxine (SYNTHROID, LEVOTHROID) 175 MCG tablet Take 1 tablet (175  mcg total) by mouth daily before breakfast. 90 tablet 3  . lisinopril (PRINIVIL,ZESTRIL) 5 MG tablet TAKE 1 TABLET BY MOUTH EVERY DAY 30 tablet 3  . omeprazole (PRILOSEC) 20 MG capsule Take 1 capsule (20 mg total) by mouth daily. 30 capsule 0   No current facility-administered medications on file prior to visit.     No Known Allergies  Family History  Problem Relation Age of Onset  . Diabetes Mother   . Hypothyroidism Mother   . Cancer Mother   . Hypertension Mother   . Cancer Brother   . Hyperthyroidism Paternal Grandfather     BP 110/80 (BP Location: Left Arm, Patient Position: Sitting, Cuff Size: Normal)   Pulse 71   Temp 97.7 F (36.5 C) (Oral)   Ht 6' (1.829 m)   Wt 228 lb 3.2 oz (103.5 kg)   SpO2 97%   BMI 30.95 kg/m    Review of Systems He seldom has hypoglycemia    Objective:   Physical Exam VITAL SIGNS:  See vs  page GENERAL: no distress Pulses: foot pulses are intact bilaterally.   MSK: no deformity of the feet or ankles.  CV: no edema of the legs or ankles Skin:  no ulcer on the feet or ankles.  normal color and temp on the feet and ankles Neuro: sensation is intact to touch on the feet and ankles.     Lab Results  Component Value Date   HGBA1C 8.7 06/09/2017      Assessment & Plan:  Type 1 DM: he needs increased rx.  Hypothyroidism: due for recheck.    Patient Instructions  check your blood sugar twice a day, to calibrate your monitor.  vary the time of day when you check, between before the 3 meals, and at bedtime.  also check if you have symptoms of your blood sugar being too high or too low.  please keep a record of the readings and bring it to your next appointment here (or you can bring the meter itself).  You can write it on any piece of paper.  please call us sooner if your blood sugar goes below 70, or if you have a lot of readings over 200.  Please take the these settings:  basal rate of 4 units/hr, noon-10 PM, and 1.6 units/hr, at all other times.  correction bolus (which some people call "sensitivity," or "insulin sensitivity ratio," or just "isr") of 1 unit for each 50 by which your glucose exceeds 100.   blood tests are requested for you today.  We'll let you know about the results.   Please come back for a follow-up appointment in 2 months.

## 2017-06-11 ENCOUNTER — Telehealth: Payer: Self-pay

## 2017-06-11 NOTE — Telephone Encounter (Signed)
Pa submitted for dexcom transmitter and sensors. Waiting on response.

## 2017-06-15 NOTE — Telephone Encounter (Signed)
PA for sensors was approved until 06/12/18

## 2017-06-17 ENCOUNTER — Telehealth: Payer: Self-pay

## 2017-06-17 NOTE — Telephone Encounter (Signed)
PA for Dexcom [place] kit has been approved through [facility] until 4/5/20and patient is aware

## 2017-08-09 ENCOUNTER — Ambulatory Visit (INDEPENDENT_AMBULATORY_CARE_PROVIDER_SITE_OTHER): Payer: 59 | Admitting: Endocrinology

## 2017-08-09 ENCOUNTER — Encounter: Payer: Self-pay | Admitting: Endocrinology

## 2017-08-09 VITALS — BP 124/78 | HR 62 | Wt 230.2 lb

## 2017-08-09 DIAGNOSIS — E1065 Type 1 diabetes mellitus with hyperglycemia: Secondary | ICD-10-CM | POA: Diagnosis not present

## 2017-08-09 DIAGNOSIS — IMO0001 Reserved for inherently not codable concepts without codable children: Secondary | ICD-10-CM

## 2017-08-09 LAB — POCT GLYCOSYLATED HEMOGLOBIN (HGB A1C): HEMOGLOBIN A1C: 8.4 % — AB (ref 4.0–5.6)

## 2017-08-09 NOTE — Patient Instructions (Addendum)
check your blood sugar twice a day, to calibrate your monitor.  vary the time of day when you check, between before the 3 meals, and at bedtime.  also check if you have symptoms of your blood sugar being too high or too low.  please keep a record of the readings and bring it to your next appointment here (or you can bring the meter itself).  You can write it on any piece of paper.  please call us sooner if your blood sugar goes below 70, or if you have a lot of readings over 200.  Please take the these settings:  basal rate of 4.4 units/hr, noon-10 PM, and 1.6 units/hr, at all other times.  correction bolus (which some people call "sensitivity," or "insulin sensitivity ratio," or just "isr") of 1 unit for each 50 by which your glucose exceeds 100.   Please come back for a follow-up appointment in 2 months.

## 2017-08-09 NOTE — Progress Notes (Signed)
Subjective:    Patient ID: David [name], male    DOB: June 05, 1995, 22 y.o.   MRN: [id number]  HPI Pt returns for f/u of diabetes mellitus: DM type: 1 Dx'ed: 2007 Complications: none Therapy: insulin since dx  DKA: never Severe hypoglycemia: never Pancreatitis: never. 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  He says glucose at work is similar to off days.    Interval history:  He takes these settings:  basal rate of 4 units/hr, noon-10 PM, and 1.6 units/hr, at all other times.  No mealtime bolus.  correction bolus (which some people call "sensitivity," or "insulin sensitivity ratio," or just "isr") of 1 unit for each 50 by which your glucose exceeds 100. no cbg record, but states cbg is in general higher as the day goes on.  pt states he feels well in general.  He does not have continuous glucose monitor with him today.  He seldom has hypoglycemia, and these episodes are mild.  He says he never misses the synthroid.  Past Medical History:  Diagnosis Date  . Diabetes mellitus   . Goiter   . Gynecomastia   . Hypoglycemia associated with diabetes (HCC)   . Hypothyroidism, acquired, autoimmune   . Thyroiditis, autoimmune     No past surgical history on file.  Social History   Socioeconomic History  . Marital status: Single    Spouse name: Not on file  . Number of children: Not on file  . Years of education: Not on file  . Highest education level: Not on file  Occupational History  . Not on file  Social Needs  . Financial resource strain: Not on file  . Food insecurity:    Worry: Not on file    Inability: Not on file  . Transportation needs:    Medical: Not on file    Non-medical: Not on file  Tobacco Use  . Smoking status: Never Smoker  . Smokeless tobacco: Never Used  Substance and Sexual Activity  . Alcohol use: No  . Drug use: No  . Sexual activity: Yes    Partners:  Female  Lifestyle  . Physical activity:    Days per week: Not on file    Minutes per session: Not on file  . Stress: Not on file  Relationships  . Social connections:    Talks on phone: Not on file    Gets together: Not on file    Attends religious service: Not on file    Active member of club or organization: Not on file    Attends meetings of clubs or organizations: Not on file    Relationship status: Not on file  . Intimate partner violence:    Fear of current or ex partner: Not on file    Emotionally abused: Not on file    Physically abused: Not on file    Forced sexual activity: Not on file  Other Topics Concern  . Not on file  Social History Narrative  . Not on file    Current Outpatient Medications on File Prior to Visit  Medication Sig Dispense Refill  . Continuous Blood Gluc Sensor (DEXCOM G5 MOB/G4 PLAT SENSOR) MISC 1 Device by Does not apply route once a week. 12 each 3  . Continuous Blood Gluc Transmit (DEXCOM G5 MOBILE TRANSMITTER) MISC 2 each by Does not apply route See admin instructions. 2 each 1  .  glucagon (GLUCAGON EMERGENCY) 1 MG injection Inject 1mg  intramuscular as needed for severe Hypoglecemia. Dispense 4= 30 days 4 each 4  . glucose blood (FREESTYLE LITE) test strip Check Bg 10x day 300 each 6  . Insulin Disposable Pump (OMNIPOD) MISC 1 Device by Does not apply route every other day. 45 each 3  . insulin lispro (HUMALOG) 100 UNIT/ML injection Use up to 300 units in insulin pump 40 mL 5  . levothyroxine (SYNTHROID, LEVOTHROID) 175 MCG tablet Take 1 tablet (175 mcg total) by mouth daily before breakfast. 90 tablet 3  . lisinopril (PRINIVIL,ZESTRIL) 5 MG tablet TAKE 1 TABLET BY MOUTH EVERY DAY 30 tablet 3  . omeprazole (PRILOSEC) 20 MG capsule Take 1 capsule (20 mg total) by mouth daily. 30 capsule 0   No current facility-administered medications on file prior to visit.     No Known Allergies  Family History  Problem Relation Age of Onset  . Diabetes  Mother   . Hypothyroidism Mother   . Cancer Mother   . Hypertension Mother   . Cancer Brother   . Hyperthyroidism Paternal Grandfather     BP 124/78   Pulse 62   Wt 230 lb 3.2 oz (104.4 kg)   SpO2 97%   BMI 31.22 kg/m    Review of Systems Denies LOC    Objective:   Physical Exam VITAL SIGNS:  See vs page GENERAL: no distress Pulses: dorsalis pedis intact bilat.   MSK: no deformity of the feet CV: no leg edema Skin:  no ulcer on the feet.  normal color and temp on the feet. Neuro: sensation is intact to touch on the feet   Lab Results  Component Value Date   HGBA1C 8.4 (A) 08/09/2017       Assessment & Plan:  Type 1 DM: he needs increased rx  Patient Instructions  check your blood sugar twice a day, to calibrate your monitor.  vary the time of day when you check, between before the 3 meals, and at bedtime.  also check if you have symptoms of your blood sugar being too high or too low.  please keep a record of the readings and bring it to your next appointment here (or you can bring the meter itself).  You can write it on any piece of paper.  please call us sooner if your blood sugar goes below 70, or if you have a lot of readings over 200.  Please take the these settings:  basal rate of 4.4 units/hr, noon-10 PM, and 1.6 units/hr, at all other times.  correction bolus (which some people call "sensitivity," or "insulin sensitivity ratio," or just "isr") of 1 unit for each 50 by which your glucose exceeds 100.   Please come back for a follow-up appointment in 2 months.

## 2017-08-16 ENCOUNTER — Telehealth: Payer: Self-pay | Admitting: Endocrinology

## 2017-08-16 NOTE — Telephone Encounter (Signed)
Patient is calling for lab test results.

## 2017-08-16 NOTE — Telephone Encounter (Signed)
LVMTCB

## 2017-09-05 IMAGING — CR DG CHEST 1V
1 series · 1 of 1 positions shown · non-contrast
Comparison: Chest x-ray of 04/01/2010

CLINICAL DATA: Pre-employment physical

EXAM:
CHEST 1 VIEW

[w chest pa]
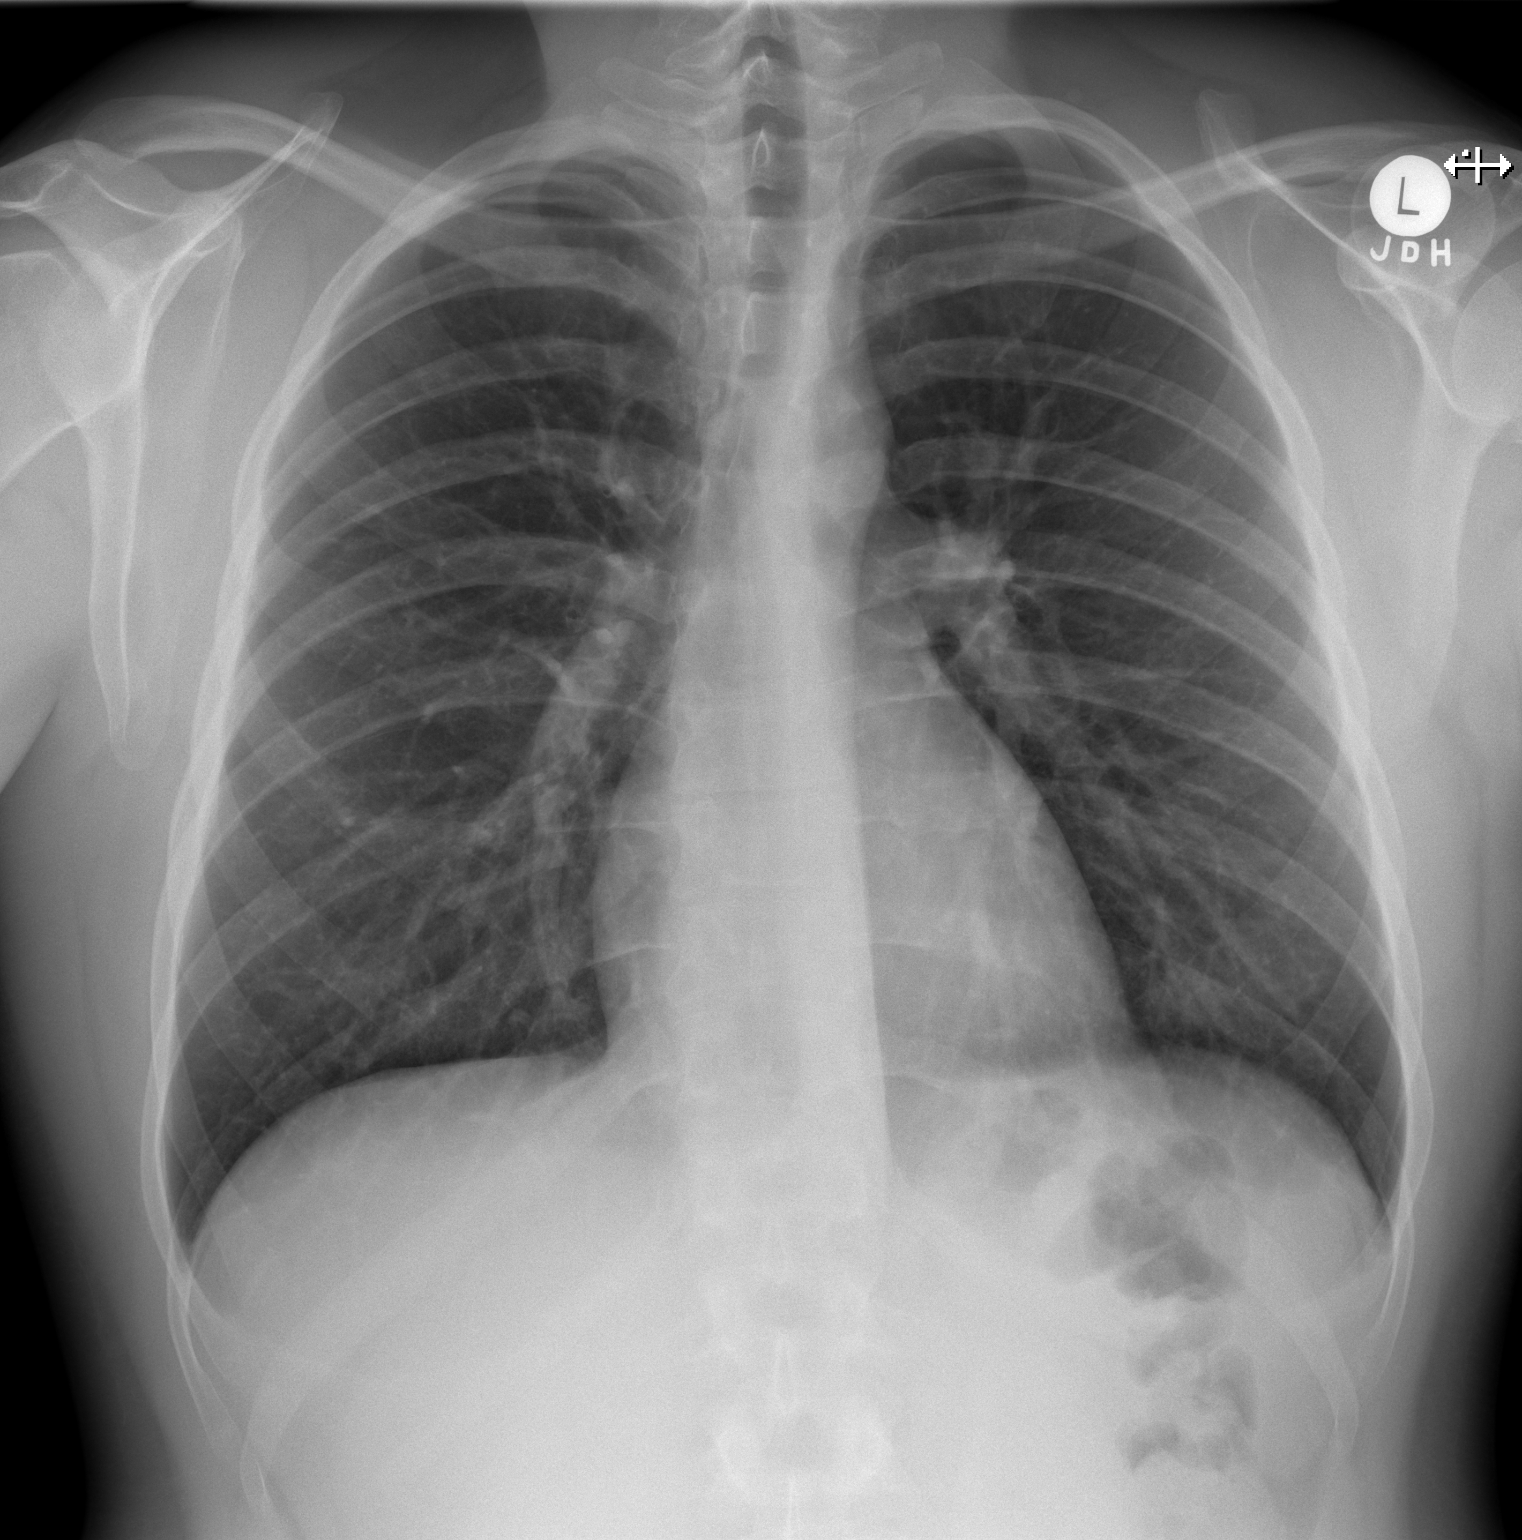

[1 of 1 positions shown; findings below may reference images not displayed]

FINDINGS: No active infiltrate or effusion is seen. Mediastinal and hilar
contours are unremarkable. The heart is within normal limits in
size. No bony abnormality is seen.
IMPRESSION: No active disease.

## 2017-11-02 ENCOUNTER — Encounter: Payer: Self-pay | Admitting: Endocrinology

## 2017-11-02 ENCOUNTER — Ambulatory Visit (INDEPENDENT_AMBULATORY_CARE_PROVIDER_SITE_OTHER): Payer: Self-pay | Admitting: Endocrinology

## 2017-11-02 VITALS — BP 116/80 | HR 61 | Ht 72.0 in | Wt 237.0 lb

## 2017-11-02 DIAGNOSIS — IMO0001 Reserved for inherently not codable concepts without codable children: Secondary | ICD-10-CM

## 2017-11-02 DIAGNOSIS — E1065 Type 1 diabetes mellitus with hyperglycemia: Secondary | ICD-10-CM

## 2017-11-02 LAB — POCT GLYCOSYLATED HEMOGLOBIN (HGB A1C): Hemoglobin A1C: 8.3 % — AB (ref 4.0–5.6)

## 2017-11-02 MED ORDER — DEXCOM G5 MOBILE TRANSMITTER MISC: 2.0000 | 1 refills | Status: DC

## 2017-11-02 NOTE — Progress Notes (Signed)
Subjective:    Patient ID: David [name], male    DOB: 1995-04-18, 22 y.o.   MRN: [id number]  HPI Pt returns for f/u of diabetes mellitus: DM type: 1 Dx'ed: 2007 Complications: none Therapy: insulin since dx  DKA: never Severe hypoglycemia: never Pancreatitis: never. 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  He says glucose at work is similar to off days.    Interval history:  He takes these settings:  basal rate of 4.4 units/hr, noon-10 PM, and 1.6 units/hr, at all other times.  No mealtime bolus.  correction bolus (which some people call "sensitivity," or "insulin sensitivity ratio," or just "isr") of 1 unit for each 50 by which your glucose exceeds 100.  Meter is downloaded today, and the printout is scanned into the record.  cbg varies from 70-400.  It is in general higher as the day goes on, except it is lowest after the evening meal.  pt states he feels well in general.  He does not have continuous glucose monitor with him today, as he does not have a transmitter.  He also has hypothyroidism.  Past Medical History:  Diagnosis Date  . Diabetes mellitus   . Goiter   . Gynecomastia   . Hypoglycemia associated with diabetes (HCC)   . Hypothyroidism, acquired, autoimmune   . Thyroiditis, autoimmune     No past surgical history on file.  Social History   Socioeconomic History  . Marital status: Single    Spouse name: Not on file  . Number of children: Not on file  . Years of education: Not on file  . Highest education level: Not on file  Occupational History  . Not on file  Social Needs  . Financial resource strain: Not on file  . Food insecurity:    Worry: Not on file    Inability: Not on file  . Transportation needs:    Medical: Not on file    Non-medical: Not on file  Tobacco Use  . Smoking status: Never Smoker  . Smokeless tobacco: Never Used    Substance and Sexual Activity  . Alcohol use: No  . Drug use: No  . Sexual activity: Yes    Partners: Female  Lifestyle  . Physical activity:    Days per week: Not on file    Minutes per session: Not on file  . Stress: Not on file  Relationships  . Social connections:    Talks on phone: Not on file    Gets together: Not on file    Attends religious service: Not on file    Active member of club or organization: Not on file    Attends meetings of clubs or organizations: Not on file    Relationship status: Not on file  . Intimate partner violence:    Fear of current or ex partner: Not on file    Emotionally abused: Not on file    Physically abused: Not on file    Forced sexual activity: Not on file  Other Topics Concern  . Not on file  Social History Narrative  . Not on file    Current Outpatient Medications on File Prior to Visit  Medication Sig Dispense Refill  . Continuous Blood Gluc Sensor (DEXCOM G5 MOB/G4 PLAT SENSOR) MISC 1 Device by Does not apply route once a week. 12 each 3  .  glucagon (GLUCAGON EMERGENCY) 1 MG injection Inject 1mg  intramuscular as needed for severe Hypoglecemia. Dispense 4= 30 days 4 each 4  . glucose blood (FREESTYLE LITE) test strip Check Bg 10x day 300 each 6  . Insulin Disposable Pump (OMNIPOD) MISC 1 Device by Does not apply route every other day. 45 each 3  . insulin lispro (HUMALOG) 100 UNIT/ML injection Use up to 300 units in insulin pump 40 mL 5  . levothyroxine (SYNTHROID, LEVOTHROID) 175 MCG tablet Take 1 tablet (175 mcg total) by mouth daily before breakfast. 90 tablet 3  . lisinopril (PRINIVIL,ZESTRIL) 5 MG tablet TAKE 1 TABLET BY MOUTH EVERY DAY 30 tablet 3  . omeprazole (PRILOSEC) 20 MG capsule Take 1 capsule (20 mg total) by mouth daily. 30 capsule 0   No current facility-administered medications on file prior to visit.     No Known Allergies  Family History  Problem Relation Age of Onset  . Diabetes Mother   . Hypothyroidism  Mother   . Cancer Mother   . Hypertension Mother   . Cancer Brother   . Hyperthyroidism Paternal Grandfather     BP 116/80 (BP Location: Left Arm, Patient Position: Sitting, Cuff Size: Normal)   Pulse 61   Ht 6' (1.829 m)   Wt 237 lb (107.5 kg)   SpO2 97%   BMI 32.14 kg/m    Review of Systems He denies hypoglycemia.      Objective:   Physical Exam VITAL SIGNS:  See vs page GENERAL: no distress Pulses: foot pulses are intact bilaterally.   MSK: no deformity of the feet or ankles.  CV: no edema of the legs or ankles.   Skin:  no ulcer on the feet or ankles.  normal color and temp on the feet and ankles.   Neuro: sensation is intact to touch on the feet and ankles.     A1c=8.3%     Assessment & Plan:  Type 1 DM: she needs increased rx.    Patient Instructions  check your blood sugar twice a day, to calibrate your monitor.  vary the time of day when you check, between before the 3 meals, and at bedtime.  also check if you have symptoms of your blood sugar being too high or too low.  please keep a record of the readings and bring it to your next appointment here (or you can bring the meter itself).  You can write it on any piece of paper.  please call us sooner if your blood sugar goes below 70, or if you have a lot of readings over 200.  Please take the these settings:  basal rate of 4.6 units/hr, noon-10 PM, and 1.6 units/hr, at all other times.  correction bolus (which some people call "sensitivity," or "insulin sensitivity ratio," or just "isr") of 1 unit for each 50 by which your glucose exceeds 100.  Please come back for a follow-up appointment in 2 months.   Here is a refill prescription, for your continuous glucose monitor transmitter.

## 2017-11-02 NOTE — Patient Instructions (Addendum)
check your blood sugar twice a day, to calibrate your monitor.  vary the time of day when you check, between before the 3 meals, and at bedtime.  also check if you have symptoms of your blood sugar being too high or too low.  please keep a record of the readings and bring it to your next appointment here (or you can bring the meter itself).  You can write it on any piece of paper.  please call us sooner if your blood sugar goes below 70, or if you have a lot of readings over 200.  Please take the these settings:  basal rate of 4.6 units/hr, noon-10 PM, and 1.6 units/hr, at all other times.  correction bolus (which some people call "sensitivity," or "insulin sensitivity ratio," or just "isr") of 1 unit for each 50 by which your glucose exceeds 100.  Please come back for a follow-up appointment in 2 months.   Here is a refill prescription, for your continuous glucose monitor transmitter.

## 2017-12-13 ENCOUNTER — Telehealth: Payer: Self-pay | Admitting: Endocrinology

## 2017-12-13 ENCOUNTER — Other Ambulatory Visit: Payer: Self-pay

## 2017-12-13 MED ORDER — INSULIN ASPART 100 UNIT/ML ~~LOC~~ SOLN: SUBCUTANEOUS | 99 refills | Status: DC

## 2017-12-13 NOTE — Telephone Encounter (Signed)
Rx has been changed and sent to pharmacy.  

## 2017-12-13 NOTE — Telephone Encounter (Signed)
Novolog is OK

## 2017-12-13 NOTE — Telephone Encounter (Signed)
Dr. Everardo All please advise,  [facility] sent fax stating Humalog is not covered. How would you like to proceed

## 2018-01-03 ENCOUNTER — Ambulatory Visit (INDEPENDENT_AMBULATORY_CARE_PROVIDER_SITE_OTHER): Payer: BLUE CROSS/BLUE SHIELD | Admitting: Endocrinology

## 2018-01-03 ENCOUNTER — Encounter: Payer: Self-pay | Admitting: Endocrinology

## 2018-01-03 VITALS — BP 112/62 | HR 66 | Ht 72.0 in | Wt 246.0 lb

## 2018-01-03 DIAGNOSIS — IMO0001 Reserved for inherently not codable concepts without codable children: Secondary | ICD-10-CM

## 2018-01-03 DIAGNOSIS — E1065 Type 1 diabetes mellitus with hyperglycemia: Secondary | ICD-10-CM | POA: Diagnosis not present

## 2018-01-03 DIAGNOSIS — F54 Psychological and behavioral factors associated with disorders or diseases classified elsewhere: Secondary | ICD-10-CM | POA: Diagnosis not present

## 2018-01-03 DIAGNOSIS — E063 Autoimmune thyroiditis: Secondary | ICD-10-CM

## 2018-01-03 LAB — POCT GLYCOSYLATED HEMOGLOBIN (HGB A1C): HEMOGLOBIN A1C: 8.4 % — AB (ref 4.0–5.6)

## 2018-01-03 NOTE — Progress Notes (Signed)
Subjective:    Patient ID: David [name], male    DOB: 11/11/1995, 22 y.o.   MRN: [id number]  HPI Pt returns for f/u of diabetes mellitus: DM type: 1 Dx'ed: 2007 Complications: none Therapy: insulin since dx  DKA: never Severe hypoglycemia: never Pancreatitis: never. 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  He says glucose at work is similar to off days.    Interval history:  He takes these settings:  basal rate of 4.6 units/hr, noon-10 PM, and 1.6 units/hr, at all other times.  No mealtime bolus correction bolus (which some people call "sensitivity," or "insulin sensitivity ratio," or just "isr") of 1 unit for each 50 by which your glucose exceeds 100.  Meter is downloaded today, and the printout is scanned into the record.  cbg varies from 70-400.  It is in general higher as the day goes on, except it is lowest after the evening meal.  pt states he feels well in general.  He does not have continuous glucose monitor with him today. He also has hypothyroidism.   Past Medical History:  Diagnosis Date  . Diabetes mellitus   . Goiter   . Gynecomastia   . Hypoglycemia associated with diabetes (HCC)   . Hypothyroidism, acquired, autoimmune   . Thyroiditis, autoimmune     No past surgical history on file.  Social History   Socioeconomic History  . Marital status: Single    Spouse name: Not on file  . Number of children: Not on file  . Years of education: Not on file  . Highest education level: Not on file  Occupational History  . Not on file  Social Needs  . Financial resource strain: Not on file  . Food insecurity:    Worry: Not on file    Inability: Not on file  . Transportation needs:    Medical: Not on file    Non-medical: Not on file  Tobacco Use  . Smoking status: Never Smoker  . Smokeless tobacco: Never Used  Substance and Sexual Activity  .  Alcohol use: No  . Drug use: No  . Sexual activity: Yes    Partners: Female  Lifestyle  . Physical activity:    Days per week: Not on file    Minutes per session: Not on file  . Stress: Not on file  Relationships  . Social connections:    Talks on phone: Not on file    Gets together: Not on file    Attends religious service: Not on file    Active member of club or organization: Not on file    Attends meetings of clubs or organizations: Not on file    Relationship status: Not on file  . Intimate partner violence:    Fear of current or ex partner: Not on file    Emotionally abused: Not on file    Physically abused: Not on file    Forced sexual activity: Not on file  Other Topics Concern  . Not on file  Social History Narrative  . Not on file    Current Outpatient Medications on File Prior to Visit  Medication Sig Dispense Refill  . Continuous Blood Gluc Sensor (DEXCOM G5 MOB/G4 PLAT SENSOR) MISC 1 Device by Does not apply route once a week. 12 each 3  . Continuous Blood Gluc Transmit (DEXCOM G5 MOBILE TRANSMITTER) MISC  2 each by Does not apply route See admin instructions. 2 each 1  . glucagon (GLUCAGON EMERGENCY) 1 MG injection Inject 1mg  intramuscular as needed for severe Hypoglecemia. Dispense 4= 30 days 4 each 4  . glucose blood (FREESTYLE LITE) test strip Check Bg 10x day 300 each 6  . insulin aspart (NOVOLOG) 100 UNIT/ML injection Use up to 300 units in pump DX E11.65 3 vial PRN  . Insulin Disposable Pump (OMNIPOD) MISC 1 Device by Does not apply route every other day. 45 each 3  . levothyroxine (SYNTHROID, LEVOTHROID) 175 MCG tablet Take 1 tablet (175 mcg total) by mouth daily before breakfast. 90 tablet 3  . lisinopril (PRINIVIL,ZESTRIL) 5 MG tablet TAKE 1 TABLET BY MOUTH EVERY DAY 30 tablet 3  . omeprazole (PRILOSEC) 20 MG capsule Take 1 capsule (20 mg total) by mouth daily. 30 capsule 0   No current facility-administered medications on file prior to visit.     No  Known Allergies  Family History  Problem Relation Age of Onset  . Diabetes Mother   . Hypothyroidism Mother   . Cancer Mother   . Hypertension Mother   . Cancer Brother   . Hyperthyroidism Paternal Grandfather     BP 112/62 (BP Location: Left Arm, Patient Position: Sitting, Cuff Size: Normal)   Pulse 66   Ht 6' (1.829 m)   Wt 246 lb (111.6 kg)   SpO2 92%   BMI 33.36 kg/m    Review of Systems He denies hypoglycemia    Objective:   Physical Exam VITAL SIGNS:  See vs page GENERAL: no distress Pulses: foot pulses are intact bilaterally.   MSK: no deformity of the feet or ankles.  CV: no edema of the legs or ankles Skin:  no ulcer on the feet or ankles.  normal color and temp on the feet and ankles Neuro: sensation is intact to touch on the feet and ankles.    Lab Results  Component Value Date   HGBA1C 8.4 (A) 01/03/2018      Assessment & Plan:  Type 1 DM: worse Hypothyroidism: recheck today.  Patient Instructions  check your blood sugar twice a day, to calibrate your monitor.  vary the time of day when you check, between before the 3 meals, and at bedtime.  also check if you have symptoms of your blood sugar being too high or too low.  please keep a record of the readings and bring it to your next appointment here (or you can bring the meter itself).  You can write it on any piece of paper.  please call us sooner if your blood sugar goes below 70, or if you have a lot of readings over 200.  Please take the these settings:  basal rate of 4.7 units/hr, noon-10 PM, and 1.5 units/hr, at all other times.  correction bolus (which some people call "sensitivity," or "insulin sensitivity ratio," or just "isr") of 1 unit for each 50 by which your glucose exceeds 100.  On this type of pump schedule, you should eat meals on a regular schedule.  If a meal is missed or significantly delayed, your blood sugar could go low.  Please come back for a follow-up appointment in 3 months.

## 2018-01-03 NOTE — Patient Instructions (Addendum)
check your blood sugar twice a day, to calibrate your monitor.  vary the time of day when you check, between before the 3 meals, and at bedtime.  also check if you have symptoms of your blood sugar being too high or too low.  please keep a record of the readings and bring it to your next appointment here (or you can bring the meter itself).  You can write it on any piece of paper.  please call us sooner if your blood sugar goes below 70, or if you have a lot of readings over 200.  Please take the these settings:  basal rate of 4.7 units/hr, noon-10 PM, and 1.5 units/hr, at all other times.  correction bolus (which some people call "sensitivity," or "insulin sensitivity ratio," or just "isr") of 1 unit for each 50 by which your glucose exceeds 100.  On this type of pump schedule, you should eat meals on a regular schedule.  If a meal is missed or significantly delayed, your blood sugar could go low.  Please come back for a follow-up appointment in 3 months.

## 2018-01-04 ENCOUNTER — Telehealth: Payer: Self-pay

## 2018-01-04 NOTE — Telephone Encounter (Signed)
Dr. Everardo All completed documents for [place] [facility], [place]. Copies made (pt and office for scanning purposes). Mailed original documents to [facility], [address] [place] [address]. Copy mailed to pt for his records. Remaining copy sent to medical records for scanning purposes.

## 2018-01-18 ENCOUNTER — Other Ambulatory Visit: Payer: Self-pay | Admitting: Endocrinology

## 2018-01-18 NOTE — Telephone Encounter (Signed)
done

## 2018-01-18 NOTE — Telephone Encounter (Signed)
I have checked my faxed file and do not have this order/document. Have you received a document or order from [facility]? If not, I will ask that they refax.

## 2018-01-18 NOTE — Telephone Encounter (Signed)
[facility] called to inquire about pts OMNIPOD RX. Fax was sent on Friday, and they have not received a response.

## 2018-01-18 NOTE — Telephone Encounter (Signed)
[facility] order has been completed and signed by Dr. Everardo All. Order has been faxed successfully to # [id number]. Document and fax confirmation have been placed in the faxed file for future reference.

## 2018-04-06 ENCOUNTER — Telehealth: Payer: Self-pay | Admitting: Endocrinology

## 2018-04-06 ENCOUNTER — Ambulatory Visit: Payer: BLUE CROSS/BLUE SHIELD | Admitting: Endocrinology

## 2018-04-06 DIAGNOSIS — Z0289 Encounter for other administrative examinations: Secondary | ICD-10-CM

## 2018-04-06 NOTE — Telephone Encounter (Signed)
Please refer to Dr. [name]'s response 

## 2018-04-06 NOTE — Telephone Encounter (Signed)
LMTCB to reschedule missed appointment °

## 2018-04-06 NOTE — Telephone Encounter (Signed)
Patient no showed today's appt. Please advise on how to follow up. °A. No follow up necessary. °B. Follow up urgent. Contact patient immediately. °C. Follow up necessary. Contact patient and schedule visit in ___ days. °D. Follow up advised. Contact patient and schedule visit in ____weeks. ° °Would you like the NS fee to be applied to this visit? ° °

## 2018-04-06 NOTE — Telephone Encounter (Signed)
Please schedule f/u appt for next available appointment  

## 2018-04-08 ENCOUNTER — Other Ambulatory Visit: Payer: Self-pay | Admitting: Endocrinology

## 2018-04-08 MED ORDER — INSULIN ASPART 100 UNIT/ML ~~LOC~~ SOLN: SUBCUTANEOUS | 99 refills | Status: DC

## 2018-05-18 ENCOUNTER — Other Ambulatory Visit: Payer: Self-pay | Admitting: Endocrinology

## 2018-08-08 ENCOUNTER — Other Ambulatory Visit: Payer: Self-pay | Admitting: Endocrinology

## 2018-08-15 ENCOUNTER — Ambulatory Visit (INDEPENDENT_AMBULATORY_CARE_PROVIDER_SITE_OTHER): Payer: BC Managed Care – PPO | Admitting: Endocrinology

## 2018-08-15 ENCOUNTER — Other Ambulatory Visit: Payer: Self-pay

## 2018-08-15 DIAGNOSIS — E039 Hypothyroidism, unspecified: Secondary | ICD-10-CM

## 2018-08-15 DIAGNOSIS — E10649 Type 1 diabetes mellitus with hypoglycemia without coma: Secondary | ICD-10-CM | POA: Diagnosis not present

## 2018-08-15 MED ORDER — LEVOTHYROXINE SODIUM 175 MCG PO TABS: 175.0000 ug | ORAL_TABLET | Freq: Every day | ORAL | 3 refills | Status: DC

## 2018-08-15 MED ORDER — DEXCOM G5 MOB/G4 PLAT SENSOR MISC: 1.0000 | 3 refills | Status: DC

## 2018-08-15 NOTE — Progress Notes (Signed)
Subjective:    Patient ID: David [name], male    DOB: 1995-08-02, 23 y.o.   MRN: [id number]  HPI telehealth visit today via phone x 9 minutes Alternatives to telehealth are presented to this patient, and the patient agrees to the telehealth visit.   Pt is advised of the cost of the visit, and agrees to this, also.   Patient is at home, and I am at the office.   Persons attending the telehealth visit: the patient and I Pt returns for f/u of diabetes mellitus: DM type: 1 Dx'ed: 2007 Complications: none Therapy: insulin since dx  DKA: never Severe hypoglycemia: never Pancreatitis: never. 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  He says glucose at work is similar to off days.    Interval history:  He takes these settings:  basal rate of 4.7 units/hr, noon-10 PM, and 1.5 units/hr, at all other times.  No mealtime bolus.   correction bolus (which some people call "sensitivity," or "insulin sensitivity ratio," or just "isr") of 1 unit for each 50 by which your glucose exceeds 100.   Pt says cbg varies from 60-300.  It is in general highest after breakfast, and lowest in the afternoon.  pt states he feels well in general.  He does not currently use continuous glucose monitor.   He also has hypothyroidism.  He takes synthroid as rx'ed.  Past Medical History:  Diagnosis Date  . Diabetes mellitus   . Goiter   . Gynecomastia   . Hypoglycemia associated with diabetes (HCC)   . Hypothyroidism, acquired, autoimmune   . Thyroiditis, autoimmune     No past surgical history on file.  Social History   Socioeconomic History  . Marital status: Single    Spouse name: Not on file  . Number of children: Not on file  . Years of education: Not on file  . Highest education level: Not on file  Occupational History  . Not on file  Social Needs  . Financial resource strain: Not on  file  . Food insecurity:    Worry: Not on file    Inability: Not on file  . Transportation needs:    Medical: Not on file    Non-medical: Not on file  Tobacco Use  . Smoking status: Never Smoker  . Smokeless tobacco: Never Used  Substance and Sexual Activity  . Alcohol use: No  . Drug use: No  . Sexual activity: Yes    Partners: Female  Lifestyle  . Physical activity:    Days per week: Not on file    Minutes per session: Not on file  . Stress: Not on file  Relationships  . Social connections:    Talks on phone: Not on file    Gets together: Not on file    Attends religious service: Not on file    Active member of club or organization: Not on file    Attends meetings of clubs or organizations: Not on file    Relationship status: Not on file  . Intimate partner violence:    Fear of current or ex partner: Not on file    Emotionally abused: Not on file    Physically abused: Not on file    Forced sexual activity: Not on file  Other Topics Concern  . Not on file  Social History Narrative  . Not  on file    Current Outpatient Medications on File Prior to Visit  Medication Sig Dispense Refill  . glucagon (GLUCAGON EMERGENCY) 1 MG injection Inject 1mg  intramuscular as needed for severe Hypoglecemia. Dispense 4= 30 days 4 each 4  . glucose blood (FREESTYLE LITE) test strip Check Bg 10x day 300 each 6  . insulin aspart (NOVOLOG) 100 UNIT/ML injection For use in pump, total of 120 units/day. 4 vial PRN  . Insulin Disposable Pump (OMNIPOD) MISC 1 Device by Does not apply route every other day. 45 each 3  . lisinopril (PRINIVIL,ZESTRIL) 5 MG tablet TAKE 1 TABLET BY MOUTH EVERY DAY 30 tablet 3  . omeprazole (PRILOSEC) 20 MG capsule Take 1 capsule (20 mg total) by mouth daily. 30 capsule 0  . Continuous Blood Gluc Transmit (DEXCOM G5 MOBILE TRANSMITTER) MISC 2 each by Does not apply route See admin instructions. (Patient not taking: Reported on 08/12/2018) 2 each 1   No current  facility-administered medications on file prior to visit.     No Known Allergies  Family History  Problem Relation Age of Onset  . Diabetes Mother   . Hypothyroidism Mother   . Cancer Mother   . Hypertension Mother   . Cancer Brother   . Hyperthyroidism Paternal Grandfather     Review of Systems He denies hypoglycemia.      Objective:   Physical Exam       Assessment & Plan:  Type 1 DM: uncertain glycemic control. Hypoglycemia: This limits aggressiveness of glycemic control.  Hypothyroidism: due for recheck.     Patient Instructions  check your blood sugar twice a day, to calibrate your monitor.  vary the time of day when you check, between before the 3 meals, and at bedtime.  also check if you have symptoms of your blood sugar being too high or too low.  please keep a record of the readings and bring it to your next appointment here (or you can bring the meter itself).  You can write it on any piece of paper.  please call us sooner if your blood sugar goes below 70, or if you have a lot of readings over 200.  Please take the these settings:  basal rate of 4.7 units/hr, noon-10 PM, and 1.5 units/hr, at all other times.  correction bolus (which some people call "sensitivity," or "insulin sensitivity ratio," or just "isr") of 1 unit for each 50 by which your glucose exceeds 100.  On this type of pump schedule, you should eat meals on a regular schedule (especially lunch).  If a meal is missed or significantly delayed, your blood sugar could go low.  I have sent a prescription to your pharmacy, for the continuous glucose monitor.  Blood tests are requested for you today.  We'll let you know about the results.  Please come back for a follow-up appointment in 2-3 months.

## 2018-08-15 NOTE — Patient Instructions (Addendum)
check your blood sugar twice a day, to calibrate your monitor.  vary the time of day when you check, between before the 3 meals, and at bedtime.  also check if you have symptoms of your blood sugar being too high or too low.  please keep a record of the readings and bring it to your next appointment here (or you can bring the meter itself).  You can write it on any piece of paper.  please call us sooner if your blood sugar goes below 70, or if you have a lot of readings over 200.  Please take the these settings:  basal rate of 4.7 units/hr, noon-10 PM, and 1.5 units/hr, at all other times.  correction bolus (which some people call "sensitivity," or "insulin sensitivity ratio," or just "isr") of 1 unit for each 50 by which your glucose exceeds 100.  On this type of pump schedule, you should eat meals on a regular schedule (especially lunch).  If a meal is missed or significantly delayed, your blood sugar could go low.  I have sent a prescription to your pharmacy, for the continuous glucose monitor.  Blood tests are requested for you today.  We'll let you know about the results.  Please come back for a follow-up appointment in 2-3 months.

## 2018-11-21 ENCOUNTER — Telehealth: Payer: Self-pay | Admitting: Endocrinology

## 2018-11-21 ENCOUNTER — Other Ambulatory Visit: Payer: Self-pay

## 2018-11-21 NOTE — Telephone Encounter (Signed)
MEDICATION: OmniPod Supplies  PHARMACY:  [facility] FAX # [phone]  IS THIS A 90 DAY SUPPLY : Yes  IS PATIENT OUT OF MEDICATION:   IF NOT; HOW MUCH IS LEFT:   LAST APPOINTMENT DATE: @6 /10/2018  NEXT APPOINTMENT DATE:@Visit  date not found  DO WE HAVE YOUR PERMISSION TO LEAVE A DETAILED MESSAGE:  OTHER COMMENTS:    **Let patient know to contact pharmacy at the end of the day to make sure medication is ready. **  ** Please notify patient to allow 48-72 hours to process**  **Encourage patient to contact the pharmacy for refills or they can request refills through [facility]**

## 2018-11-21 NOTE — Telephone Encounter (Signed)
I am not familiar with this mail order service. Please advise if this request should be sent to DME supplier

## 2018-11-22 ENCOUNTER — Telehealth: Payer: Self-pay | Admitting: Endocrinology

## 2018-11-22 NOTE — Telephone Encounter (Signed)
Called pt to further discuss his request. States that a "rep" called to advise him that this is who Christella Scheuermann is using. Advised this sounds like it may be a scam. Informed pt about the process that is involved with processing DME refill requests and that generally a DME supplier handles this type of refill request. Asked that he call his insurance company to determine what DME supplier is covered by his plan. This type of request does not generally go through a mail order service because of the paperwork involved to process request. Verbalized acceptance and understanding.

## 2018-11-22 NOTE — Telephone Encounter (Signed)
I have no idea what/who this pharmacy is.  I suggest you call the patient to verify that this is where is he has been  getting his supplies, and what the story is here

## 2018-11-22 NOTE — Telephone Encounter (Signed)
Called # provided below and spoke with Pompeys Pillar, ext [phone]. Informed her about the call received by [facility]. Advised this request was highly unusual as this generally comes via fax for the provider to complete along with a request to send clinical information to process DME order. Edwin Cap confirmed process and apologized for the confusion. States she is sending the request now. Will request Dr. Loanne Drilling complete order and return all required information along with order once have received [employer]'s request.

## 2018-11-22 NOTE — Telephone Encounter (Signed)
Attempted to call the # provided below. However, this call is for [employer]. Automated voice was asking for pt [employer] information. Unfortunately, we do not have this information in our system. Called pt to verify his insurance carrier and to ask that he supply our office with this information. Unable to proceed any further without pt current insurance info.  In addition, information requested by [facility] needs to be in writing and not requested via phone. Will address with Gerald Stabs once I have received pt [employer].

## 2018-11-22 NOTE — Telephone Encounter (Signed)
Gerald Stabs with [employer] [facility] ph# [phone] requests to be called at the above ph# because the Pharmacy told Gerald Stabs Dr. Loanne Drilling has questions about what to submit for the Omnipod pods. Need to: Send RX order for the Ominpods DX Code NPI# Patient's clinical or face to face office notes for the last 6 months Patient Demographics

## 2018-11-22 NOTE — Telephone Encounter (Signed)
Christella Scheuermann Member ID is [id number] [place] # is [id number]

## 2018-11-23 ENCOUNTER — Other Ambulatory Visit: Payer: Self-pay

## 2018-11-23 DIAGNOSIS — IMO0001 Reserved for inherently not codable concepts without codable children: Secondary | ICD-10-CM

## 2018-11-23 MED ORDER — OMNIPOD DASH PODS (GEN 4) MISC: 1.0000 | 11 refills | Status: DC

## 2018-11-23 NOTE — Telephone Encounter (Signed)
Patient is scheduled for 12/01/18 at 7:45 a.m.

## 2018-11-23 NOTE — Telephone Encounter (Signed)
Documents received from [employer] [facility] and have been completed. Unfortunately, no fax # was provided with the forms. Caro Laroche and asked that she call with a fax #. Left detailed VM asking she return call with this #. Orders remain pending this information

## 2018-11-23 NOTE — Telephone Encounter (Signed)
Per Dr. Loanne Drilling, pt is overdue for an appt and has requested pt schedule an appt. Routing message to front desk for scheduling purposes.

## 2018-11-23 NOTE — Telephone Encounter (Signed)
Attempted x3 to fax documents to [phone]. Failed x3. Caro Laroche @ [phone] ext [phone] to verify fax #. Provided [phone].   Company: [facility]  Document: Referral form and Physician's order Other records requested: N/A  All above requested information has been faxed successfully to the Company listed above. Documents and fax confirmation have been placed in the faxed file for future reference.

## 2018-11-29 ENCOUNTER — Other Ambulatory Visit: Payer: Self-pay

## 2018-12-01 ENCOUNTER — Ambulatory Visit (INDEPENDENT_AMBULATORY_CARE_PROVIDER_SITE_OTHER): Payer: Managed Care, Other (non HMO) | Admitting: Endocrinology

## 2018-12-01 ENCOUNTER — Other Ambulatory Visit: Payer: Self-pay

## 2018-12-01 ENCOUNTER — Encounter: Payer: Self-pay | Admitting: Endocrinology

## 2018-12-01 VITALS — BP 100/60 | HR 69 | Ht 72.0 in | Wt 259.2 lb

## 2018-12-01 DIAGNOSIS — E10649 Type 1 diabetes mellitus with hypoglycemia without coma: Secondary | ICD-10-CM

## 2018-12-01 DIAGNOSIS — E1065 Type 1 diabetes mellitus with hyperglycemia: Secondary | ICD-10-CM

## 2018-12-01 DIAGNOSIS — IMO0001 Reserved for inherently not codable concepts without codable children: Secondary | ICD-10-CM

## 2018-12-01 LAB — POCT GLYCOSYLATED HEMOGLOBIN (HGB A1C): Hemoglobin A1C: 9.1 % — AB (ref 4.0–5.6)

## 2018-12-01 LAB — BASIC METABOLIC PANEL
BUN: 13 mg/dL (ref 6–23)
CO2: 31 mEq/L (ref 19–32)
Calcium: 9.5 mg/dL (ref 8.4–10.5)
Chloride: 101 mEq/L (ref 96–112)
Creatinine, Ser: 0.86 mg/dL (ref 0.40–1.50)
GFR: 108.64 mL/min (ref >60.00)
Glucose, Bld: 150 mg/dL — ABNORMAL HIGH (ref 70–99)
Potassium: 4.1 mEq/L (ref 3.5–5.1)
Sodium: 138 mEq/L (ref 135–145)

## 2018-12-01 LAB — T4, FREE: Free T4: 0.73 ng/dL (ref 0.60–1.60)

## 2018-12-01 LAB — TSH: TSH: 12.36 u[IU]/mL — ABNORMAL HIGH (ref 0.35–4.50)

## 2018-12-01 MED ORDER — DEXCOM G5 MOBILE TRANSMITTER MISC: 2.0000 | 1 refills | Status: DC

## 2018-12-01 MED ORDER — LEVOTHYROXINE SODIUM 200 MCG PO TABS: 200.0000 ug | ORAL_TABLET | Freq: Every day | ORAL | 3 refills | Status: DC

## 2018-12-01 NOTE — Patient Instructions (Addendum)
check your blood sugar twice a day, to calibrate your monitor.  vary the time of day when you check, between before the 3 meals, and at bedtime.  also check if you have symptoms of your blood sugar being too high or too low.  please keep a record of the readings and bring it to your next appointment here (or you can bring the meter itself).  You can write it on any piece of paper.  please call us sooner if your blood sugar goes below 70, or if you have a lot of readings over 200.  Please take the these settings:  basal rate of 4.6 units/hr, noon-10 PM, and 1.8 units/hr, at all other times.  correction bolus (which some people call "sensitivity," or "insulin sensitivity ratio," or just "isr") of 1 unit for each 50 by which your glucose exceeds 100.  On this type of pump schedule, you should eat meals on a regular schedule (especially lunch).  If a meal is missed or significantly delayed, your blood sugar could go low.  When we get the form for continuous glucose monitor, we'll fill it out.   Blood tests are requested for you today.  We'll let you know about the results.  Please come back for a follow-up appointment in 2 months.

## 2018-12-01 NOTE — Progress Notes (Signed)
Subjective:    Patient ID: David [name], male    DOB: [birth year], 23 y.o.   MRN: [id number]  HPI Pt returns for f/u of diabetes mellitus: DM type: 1 Dx'ed: 2007 Complications: none Therapy: insulin since dx  DKA: never Severe hypoglycemia: never Pancreatitis: never. 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  He says glucose at work is similar to off days.    Interval history:   basal rate of 4.6 units/hr, noon-10 PM, and 1.6 units/hr, at all other times.  correction bolus (which some people call "sensitivity," or "insulin sensitivity ratio," or just "isr") of 1 unit for each 50 by which your glucose exceeds 100. Pt says cbg varies from 49-400.  It is in general higher fasting than at HS.  pt states he feels well in general.  Meter is downloaded, but there are no cbg's.  He seldom has hypoglycemia, and these episodes are mild. This happens with activity.  TDD is 66 units (he occasionally takes a correction bolus).  He also has hypothyroidism.  He takes synthroid as rx'ed.   Past Medical History:  Diagnosis Date  . Diabetes mellitus   . Goiter   . Gynecomastia   . Hypoglycemia associated with diabetes (HCC)   . Hypothyroidism, acquired, autoimmune   . Thyroiditis, autoimmune     No past surgical history on file.  Social History   Socioeconomic History  . Marital status: Single    Spouse name: Not on file  . Number of children: Not on file  . Years of education: Not on file  . Highest education level: Not on file  Occupational History  . Not on file  Social Needs  . Financial resource strain: Not on file  . Food insecurity    Worry: Not on file    Inability: Not on file  . Transportation needs    Medical: Not on file    Non-medical: Not on file  Tobacco Use  . Smoking status: Never Smoker  . Smokeless tobacco: Never Used  Substance and Sexual Activity  .  Alcohol use: No  . Drug use: No  . Sexual activity: Yes    Partners: Female  Lifestyle  . Physical activity    Days per week: Not on file    Minutes per session: Not on file  . Stress: Not on file  Relationships  . Social Musician on phone: Not on file    Gets together: Not on file    Attends religious service: Not on file    Active member of club or organization: Not on file    Attends meetings of clubs or organizations: Not on file    Relationship status: Not on file  . Intimate partner violence    Fear of current or ex partner: Not on file    Emotionally abused: Not on file    Physically abused: Not on file    Forced sexual activity: Not on file  Other Topics Concern  . Not on file  Social History Narrative  . Not on file    Current Outpatient Medications on File Prior to Visit  Medication Sig Dispense Refill  . glucagon (GLUCAGON EMERGENCY) 1 MG injection Inject 1mg  intramuscular as needed for severe Hypoglecemia. Dispense 4= 30 days 4 each 4  . insulin aspart (NOVOLOG) 100 UNIT/ML injection For use in  pump, total of 120 units/day. 4 vial PRN  . Insulin Disposable Pump (OMNIPOD DASH 5 PACK PODS) MISC 1 each by Does not apply route as directed. Change every 3 days; E10.65 10 each 11  . Insulin Disposable Pump (OMNIPOD) MISC 1 Device by Does not apply route every other day. 45 each 3  . lisinopril (PRINIVIL,ZESTRIL) 5 MG tablet TAKE 1 TABLET BY MOUTH EVERY DAY 30 tablet 3  . omeprazole (PRILOSEC) 20 MG capsule Take 1 capsule (20 mg total) by mouth daily. 30 capsule 0  . Continuous Blood Gluc Sensor (DEXCOM G5 MOB/G4 PLAT SENSOR) MISC 1 Device by Does not apply route once a week. (Patient not taking: Reported on 12/01/2018) 12 each 3   No current facility-administered medications on file prior to visit.     No Known Allergies  Family History  Problem Relation Age of Onset  . Diabetes Mother   . Hypothyroidism Mother   . Cancer Mother   . Hypertension Mother    . Cancer Brother   . Hyperthyroidism Paternal Grandfather     BP 100/60 (BP Location: Left Arm, Patient Position: Sitting, Cuff Size: Large)   Pulse 69   Ht 6' (1.829 m)   Wt 259 lb 3.2 oz (117.6 kg)   SpO2 95%   BMI 35.15 kg/m   Review of Systems Denies LOC    Objective:   Physical Exam VITAL SIGNS:  See vs page GENERAL: no distress Pulses: dorsalis pedis intact bilat.   MSK: no deformity of the feet CV: no leg edema Skin:  no ulcer on the feet.  normal color and temp on the feet. Neuro: sensation is intact to touch on the feet   Lab Results  Component Value Date   TSH 12.36 (H) 12/01/2018   T4TOTAL 7.2 01/01/2016   Lab Results  Component Value Date   HGBA1C 9.1 (A) 12/01/2018      Assessment & Plan:  Hypothyroidism: worse.  Pt is advised to take the levothyroxine on an empty stomach Type 1 DM: he needs increased rx Hypoglycemia: this limits aggressiveness of glycemic control   Patient Instructions  check your blood sugar twice a day, to calibrate your monitor.  vary the time of day when you check, between before the 3 meals, and at bedtime.  also check if you have symptoms of your blood sugar being too high or too low.  please keep a record of the readings and bring it to your next appointment here (or you can bring the meter itself).  You can write it on any piece of paper.  please call us sooner if your blood sugar goes below 70, or if you have a lot of readings over 200.  Please take the these settings:  basal rate of 4.6 units/hr, noon-10 PM, and 1.8 units/hr, at all other times.  correction bolus (which some people call "sensitivity," or "insulin sensitivity ratio," or just "isr") of 1 unit for each 50 by which your glucose exceeds 100.  On this type of pump schedule, you should eat meals on a regular schedule (especially lunch).  If a meal is missed or significantly delayed, your blood sugar could go low.  When we get the form for continuous glucose monitor,  we'll fill it out.   Blood tests are requested for you today.  We'll let you know about the results.  Please come back for a follow-up appointment in 2 months.

## 2018-12-02 ENCOUNTER — Telehealth: Payer: Self-pay

## 2018-12-02 LAB — ANTI-ISLET CELL ANTIBODY: Islet Cell Ab: NEGATIVE

## 2018-12-02 LAB — C-PEPTIDE: C-Peptide: <0.1 ng/mL — ABNORMAL LOW (ref 1.1–4.4)

## 2018-12-02 NOTE — Telephone Encounter (Signed)
Company: [employer]  Document: Insulin pump and supplies order form Other records requested: N/A  All above requested information has been faxed successfully to [employer] listed above.Documents and fax confirmation have been placed in the faxed file for future reference.

## 2018-12-03 ENCOUNTER — Telehealth: Payer: Self-pay | Admitting: Endocrinology

## 2018-12-03 NOTE — Telephone Encounter (Signed)
please contact patient: Thyroid is low.  Please take the levothyroxine on an empty stomach

## 2018-12-05 NOTE — Telephone Encounter (Signed)
Called pt and made him aware of Dr. [name]'s recommendation. Verbalized acceptance and understanding. 

## 2018-12-16 ENCOUNTER — Other Ambulatory Visit: Payer: Self-pay | Admitting: Endocrinology

## 2019-01-24 ENCOUNTER — Other Ambulatory Visit: Payer: Self-pay | Admitting: Endocrinology

## 2019-01-27 ENCOUNTER — Other Ambulatory Visit: Payer: Self-pay

## 2019-01-30 ENCOUNTER — Other Ambulatory Visit: Payer: Self-pay

## 2019-01-30 ENCOUNTER — Encounter: Payer: Self-pay | Admitting: Endocrinology

## 2019-01-30 ENCOUNTER — Ambulatory Visit (INDEPENDENT_AMBULATORY_CARE_PROVIDER_SITE_OTHER): Payer: Managed Care, Other (non HMO) | Admitting: Endocrinology

## 2019-01-30 VITALS — BP 122/60 | HR 80 | Ht 72.0 in | Wt 241.4 lb

## 2019-01-30 DIAGNOSIS — E10649 Type 1 diabetes mellitus with hypoglycemia without coma: Secondary | ICD-10-CM

## 2019-01-30 LAB — POCT GLYCOSYLATED HEMOGLOBIN (HGB A1C): Hemoglobin A1C: 8.6 % — AB (ref 4.0–5.6)

## 2019-01-30 LAB — TSH: TSH: 2.65 u[IU]/mL (ref 0.35–4.50)

## 2019-01-30 NOTE — Patient Instructions (Addendum)
check your blood sugar twice a day, to calibrate your monitor.  vary the time of day when you check, between before the 3 meals, and at bedtime.  also check if you have symptoms of your blood sugar being too high or too low.  please keep a record of the readings and bring it to your next appointment here (or you can bring the meter itself).  You can write it on any piece of paper.  please call us sooner if your blood sugar goes below 70, or if you have a lot of readings over 200.  Please take the these settings:  basal rate of 4.8 units/hr, noon-10 PM, and 2 units/hr, at all other times.  correction bolus (which some people call "sensitivity," or "insulin sensitivity ratio," or just "isr") of 1 unit for each 50 by which your glucose exceeds 100.   On this type of pump schedule, you should eat meals on a regular schedule (especially lunch).  If a meal is missed or significantly delayed, your blood sugar could go low.   Please see a sleep specialist.  you will receive a phone call, about a day and time for an appointment Please come back for a follow-up appointment in 2 months.

## 2019-01-30 NOTE — Progress Notes (Signed)
Subjective:    Patient ID: David [name], male    DOB: 1995/11/17, 23 y.o.   MRN: [id number]  HPI Pt returns for f/u of diabetes mellitus: DM type: 1 Dx'ed: 2007 Complications: none Therapy: insulin since dx  DKA: never Severe hypoglycemia: never Pancreatitis: never. 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  He says glucose at work is similar to off days.  Higher daytime basal does not start until noon, because breakfast is usually minimal.    Interval history:   basal rate of 4.6 units/hr, noon-10 PM, and 1.8 units/hr, at all other times.  No mealtime bolus correction bolus (which some people call "sensitivity," or "insulin sensitivity ratio," or just "isr") of 1 unit for each 50 by which your glucose exceeds 100.  Meter is downloaded today, and the printout is scanned into the record.  cbg varies from 300-400.  It is in general higher fasting than at HS.  pt states he feels well in general.  Meter is downloaded, but there are no cbg's.  He seldom has hypoglycemia, and these episodes are mild. This happens with activity.  TDD is 66 units (he occasionally takes a correction bolus).  He also has hypothyroidism.  He takes synthroid as rx'ed.   Bonita QuinLinda (CDE) address the problem with dead continuous glucose monitor battery, and she downloads today.   Past Medical History:  Diagnosis Date  . Diabetes mellitus   . Goiter   . Gynecomastia   . Hypoglycemia associated with diabetes (HCC)   . Hypothyroidism, acquired, autoimmune   . Thyroiditis, autoimmune     No past surgical history on file.  Social History   Socioeconomic History  . Marital status: Single    Spouse name: Not on file  . Number of children: Not on file  . Years of education: Not on file  . Highest education level: Not on file  Occupational History  . Not on file  Social Needs  . Financial resource  strain: Not on file  . Food insecurity    Worry: Not on file    Inability: Not on file  . Transportation needs    Medical: Not on file    Non-medical: Not on file  Tobacco Use  . Smoking status: Never Smoker  . Smokeless tobacco: Never Used  Substance and Sexual Activity  . Alcohol use: No  . Drug use: No  . Sexual activity: Yes    Partners: Female  Lifestyle  . Physical activity    Days per week: Not on file    Minutes per session: Not on file  . Stress: Not on file  Relationships  . Social Musicianconnections    Talks on phone: Not on file    Gets together: Not on file    Attends religious service: Not on file    Active member of club or organization: Not on file    Attends meetings of clubs or organizations: Not on file    Relationship status: Not on file  . Intimate partner violence    Fear of current or ex partner: Not on file    Emotionally abused: Not on file    Physically abused: Not on file    Forced sexual activity: Not on file  Other Topics Concern  . Not on file  Social History Narrative  . Not on file    Current  Outpatient Medications on File Prior to Visit  Medication Sig Dispense Refill  . Continuous Blood Gluc Sensor (DEXCOM G5 MOB/G4 PLAT SENSOR) MISC 1 Device by Does not apply route once a week. 12 each 3  . Continuous Blood Gluc Transmit (DEXCOM G5 MOBILE TRANSMITTER) MISC 2 each by Does not apply route See admin instructions. 2 each 1  . glucagon (GLUCAGON EMERGENCY) 1 MG injection Inject 1mg  intramuscular as needed for severe Hypoglecemia. Dispense 4= 30 days 4 each 4  . glucose blood (FREESTYLE LITE) test strip CHECK BG 10X DAY 200 strip 10  . insulin aspart (NOVOLOG) 100 UNIT/ML injection USE UP TO 120 UNITS IN PUMP DX E11.65 40 mL 2  . Insulin Disposable Pump (OMNIPOD DASH 5 PACK PODS) MISC 1 each by Does not apply route as directed. Change every 3 days; E10.65 10 each 11  . Insulin Disposable Pump (OMNIPOD) MISC 1 Device by Does not apply route every  other day. 45 each 3  . levothyroxine (SYNTHROID) 200 MCG tablet Take 1 tablet (200 mcg total) by mouth daily before breakfast. 90 tablet 3  . lisinopril (PRINIVIL,ZESTRIL) 5 MG tablet TAKE 1 TABLET BY MOUTH EVERY DAY 30 tablet 3  . omeprazole (PRILOSEC) 20 MG capsule Take 1 capsule (20 mg total) by mouth daily. 30 capsule 0   No current facility-administered medications on file prior to visit.     No Known Allergies  Family History  Problem Relation Age of Onset  . Diabetes Mother   . Hypothyroidism Mother   . Cancer Mother   . Hypertension Mother   . Cancer Brother   . Hyperthyroidism Paternal Grandfather     BP 122/60 (BP Location: Left Arm, Patient Position: Sitting, Cuff Size: Large)   Pulse 80   Ht 6' (1.829 m)   Wt 241 lb 6.4 oz (109.5 kg)   SpO2 99%   BMI 32.74 kg/m   Review of Systems He denies hypoglycemia.  He has insomnia and daytime somnolence.      Objective:   Physical Exam VITAL SIGNS:  See vs page GENERAL: no distress Pulses: dorsalis pedis intact bilat.   MSK: no deformity of the feet CV: no leg edema Skin:  no ulcer on the feet.  normal color and temp on the feet.  Neuro: sensation is intact to touch on the feet.    Lab Results  Component Value Date   HGBA1C 8.6 (A) 01/30/2019   Lab Results  Component Value Date   TSH 2.65 01/30/2019   T4TOTAL 7.2 01/01/2016      Assessment & Plan:  Type 1 DM: he needs increased rx.   Sleep disorder, new Hypothyroidism: well-replaced.  Please continue the same medication  Patient Instructions  check your blood sugar twice a day, to calibrate your monitor.  vary the time of day when you check, between before the 3 meals, and at bedtime.  also check if you have symptoms of your blood sugar being too high or too low.  please keep a record of the readings and bring it to your next appointment here (or you can bring the meter itself).  You can write it on any piece of paper.  please call 01/03/2016 sooner if your blood  sugar goes below 70, or if you have a lot of readings over 200.  Please take the these settings:  basal rate of 4.8 units/hr, noon-10 PM, and 2 units/hr, at all other times.  correction bolus (which some people call "sensitivity," or "insulin  sensitivity ratio," or just "isr") of 1 unit for each 50 by which your glucose exceeds 100.   On this type of pump schedule, you should eat meals on a regular schedule (especially lunch).  If a meal is missed or significantly delayed, your blood sugar could go low.   Please see a sleep specialist.  you will receive a phone call, about a day and time for an appointment Please come back for a follow-up appointment in 2 months.

## 2019-01-31 ENCOUNTER — Telehealth: Payer: Self-pay

## 2019-01-31 NOTE — Telephone Encounter (Signed)
Lab results reviewed by Dr. [name]. Letter has been mailed. For future reference, letter can be found in Epic. 

## 2019-01-31 NOTE — Telephone Encounter (Signed)
-----   Message from Renato Shin, MD sent at 01/30/2019  6:23 PM EST ----- please contact patient: Normal.  Please continue the same medication

## 2019-02-14 ENCOUNTER — Other Ambulatory Visit: Payer: Self-pay | Admitting: Endocrinology

## 2019-02-14 ENCOUNTER — Telehealth: Payer: Self-pay

## 2019-02-14 NOTE — Telephone Encounter (Signed)
Company: [employer] [facility]  Document: Rx for insulin pump and supplies Other records requested: None requested  All above requested information has been faxed successfully to [employer] listed above. Documents and fax confirmation have been placed in the faxed file for future reference.

## 2019-03-06 ENCOUNTER — Other Ambulatory Visit: Payer: Self-pay | Admitting: Endocrinology

## 2019-04-06 ENCOUNTER — Other Ambulatory Visit: Payer: Self-pay

## 2019-04-06 ENCOUNTER — Encounter: Payer: Self-pay | Admitting: Endocrinology

## 2019-04-06 ENCOUNTER — Ambulatory Visit (INDEPENDENT_AMBULATORY_CARE_PROVIDER_SITE_OTHER): Payer: Managed Care, Other (non HMO) | Admitting: Endocrinology

## 2019-04-06 VITALS — BP 128/64 | HR 73 | Ht 72.0 in | Wt 246.8 lb

## 2019-04-06 DIAGNOSIS — E10649 Type 1 diabetes mellitus with hypoglycemia without coma: Secondary | ICD-10-CM | POA: Diagnosis not present

## 2019-04-06 LAB — POCT GLYCOSYLATED HEMOGLOBIN (HGB A1C): Hemoglobin A1C: 8.7 % — AB (ref 4.0–5.6)

## 2019-04-06 MED ORDER — OMNIPOD 10 PACK MISC: 1.0000 | 3 refills | Status: DC

## 2019-04-06 MED ORDER — DEXCOM G6 TRANSMITTER MISC: 1.0000 | Freq: Once | 3 refills | Status: AC

## 2019-04-06 NOTE — Progress Notes (Signed)
Subjective:    Patient ID: David [name], male    DOB: Jan 07, 1996, 24 y.o.   MRN: [id number]  HPI Pt returns for f/u of diabetes mellitus: DM type: 1 Dx'ed: 2007 Complications: none Therapy: insulin since dx  DKA: never Severe hypoglycemia: never Pancreatitis: never. SDOH: is is going through marital separation; he works as a IT sales professional, on 1 day, then off 2 days. Other: he uses an Omnipod pump, and Dexcom G5 continuous glucose monitor;   He says glucose at work is similar to off days.  Higher daytime basal does not start until noon, because breakfast is usually minimal.    Interval history:   He takes these settings: basal rate of 4.8 units/hr, noon-10 PM, and 2 units/hr, at all other times.  correction bolus (which some people call "sensitivity," or "insulin sensitivity ratio," or just "isr") of 1 unit for each 50 by which your glucose exceeds 100.   Meter is downloaded today, and the printout is scanned into the record.  cbg varies from 300-400.  It is in general higher fasting than at HS.  pt states he feels well in general.  Meter is downloaded, but there are no cbg's.  He seldom has hypoglycemia, and these episodes are mild. This happens with activity.  TDD is 66 units (he occasionally takes a correction bolus).  He also has hypothyroidism.   He needs a new continuous glucose monitor transmitter.  Pt says Omnipods are falling off after 1-2 days.  Meter is downloaded today, and the printout is scanned into the record.  There are 3 cbg's.  They vary from 360-424.  Past Medical History:  Diagnosis Date  . Diabetes mellitus   . Goiter   . Gynecomastia   . Hypoglycemia associated with diabetes (HCC)   . Hypothyroidism, acquired, autoimmune   . Thyroiditis, autoimmune     No past surgical history on file.  Social History   Socioeconomic History  . Marital status: Single    Spouse name: Not on file  . Number of children: Not on file  . Years of education: Not on file    . Highest education level: Not on file  Occupational History  . Not on file  Tobacco Use  . Smoking status: Never Smoker  . Smokeless tobacco: Never Used  Substance and Sexual Activity  . Alcohol use: No  . Drug use: No  . Sexual activity: Yes    Partners: Female  Other Topics Concern  . Not on file  Social History Narrative  . Not on file   Social Determinants of Health   Financial Resource Strain:   . Difficulty of Paying Living Expenses: Not on file  Food Insecurity:   . Worried About Programme researcher, broadcasting/film/video in the Last Year: Not on file  . Ran Out of Food in the Last Year: Not on file  Transportation Needs:   . Lack of Transportation (Medical): Not on file  . Lack of Transportation (Non-Medical): Not on file  Physical Activity:   . Days of Exercise per Week: Not on file  . Minutes of Exercise per Session: Not on file  Stress:   . Feeling of Stress : Not on file  Social Connections:   . Frequency of Communication with Friends and Family: Not on file  . Frequency of Social Gatherings with Friends and Family: Not on file  . Attends Religious Services: Not on file  . Active Member of Clubs or Organizations: Not on file  .  Attends Banker Meetings: Not on file  . Marital Status: Not on file  Intimate Partner Violence:   . Fear of Current or Ex-Partner: Not on file  . Emotionally Abused: Not on file  . Physically Abused: Not on file  . Sexually Abused: Not on file    Current Outpatient Medications on File Prior to Visit  Medication Sig Dispense Refill  . glucagon (GLUCAGON EMERGENCY) 1 MG injection Inject 1mg  intramuscular as needed for severe Hypoglecemia. Dispense 4= 30 days 4 each 4  . glucose blood (FREESTYLE LITE) test strip CHECK BG 10X DAY 200 strip 10  . insulin aspart (NOVOLOG) 100 UNIT/ML injection USE UP TO 120 UNITS IN PUMP DX E11.65 40 mL 2  . levothyroxine (SYNTHROID) 200 MCG tablet Take 1 tablet (200 mcg total) by mouth daily before breakfast.  90 tablet 3  . lisinopril (PRINIVIL,ZESTRIL) 5 MG tablet TAKE 1 TABLET BY MOUTH EVERY DAY 30 tablet 3  . omeprazole (PRILOSEC) 20 MG capsule Take 1 capsule (20 mg total) by mouth daily. 30 capsule 0   No current facility-administered medications on file prior to visit.    No Known Allergies  Family History  Problem Relation Age of Onset  . Diabetes Mother   . Hypothyroidism Mother   . Cancer Mother   . Hypertension Mother   . Cancer Brother   . Hyperthyroidism Paternal Grandfather     BP 128/64 (BP Location: Left Arm, Patient Position: Sitting, Cuff Size: Large)   Pulse 73   Ht 6' (1.829 m)   Wt 246 lb 12.8 oz (111.9 kg)   SpO2 98%   BMI 33.47 kg/m    Review of Systems He denies hypoglycemia    Objective:   Physical Exam VITAL SIGNS:  See vs page GENERAL: no distress Pulses: dorsalis pedis intact bilat.   MSK: no deformity of the feet CV: no leg edema Skin:  no ulcer on the feet.  normal color and temp on the feet. Neuro: sensation is intact to touch on the feet.   Lab Results  Component Value Date   HGBA1C 8.7 (A) 04/06/2019      Assessment & Plan:  Type 1 DM: worse Domestic situation: we discussed. pt says this is settling down, and glycemic control will soon improve.  Medical device problem: I rx'ed new transmitter.    Patient Instructions  check your blood sugar twice a day, to calibrate your monitor.  vary the time of day when you check, between before the 3 meals, and at bedtime.  also check if you have symptoms of your blood sugar being too high or too low.  please keep a record of the readings and bring it to your next appointment here (or you can bring the meter itself).  You can write it on any piece of paper.  please call 04/08/2019 sooner if your blood sugar goes below 70, or if you have a lot of readings over 200.  Please take the these settings:  basal rate of 4.8 units/hr, noon-10 PM, and 2 units/hr, at all other times.  correction bolus (which some  people call "sensitivity," or "insulin sensitivity ratio," or just "isr") of 1 unit for each 50 by which your glucose exceeds 100.   On this type of pump schedule, you should eat meals on a regular schedule (especially lunch).  If a meal is missed or significantly delayed, your blood sugar could go low.   I have sent 2 prescriptions to your pharmacy: for  the continuous glucose monitor receiver, and for Omnipods. Please come back for a follow-up appointment in 2 months.

## 2019-04-06 NOTE — Patient Instructions (Addendum)
check your blood sugar twice a day, to calibrate your monitor.  vary the time of day when you check, between before the 3 meals, and at bedtime.  also check if you have symptoms of your blood sugar being too high or too low.  please keep a record of the readings and bring it to your next appointment here (or you can bring the meter itself).  You can write it on any piece of paper.  please call us sooner if your blood sugar goes below 70, or if you have a lot of readings over 200.  Please take the these settings:  basal rate of 4.8 units/hr, noon-10 PM, and 2 units/hr, at all other times.  correction bolus (which some people call "sensitivity," or "insulin sensitivity ratio," or just "isr") of 1 unit for each 50 by which your glucose exceeds 100.   On this type of pump schedule, you should eat meals on a regular schedule (especially lunch).  If a meal is missed or significantly delayed, your blood sugar could go low.   I have sent 2 prescriptions to your pharmacy: for the continuous glucose monitor receiver, and for Omnipods. Please come back for a follow-up appointment in 2 months.

## 2019-04-12 ENCOUNTER — Other Ambulatory Visit: Payer: Self-pay | Admitting: Endocrinology

## 2019-05-26 ENCOUNTER — Telehealth: Payer: Self-pay | Admitting: Endocrinology

## 2019-05-26 NOTE — Telephone Encounter (Signed)
After review of pt chart, CMN was completed for [facility] for insulin pump and pump supplies. Therefore, pt will need to call his DME supplier ([facility]) for a refill of his supplies. An updated CMN will need to be completed as well as a new Rx (faxed to [place] by [facility]) and office notes.

## 2019-05-26 NOTE — Telephone Encounter (Signed)
As stated below, called pt and informed him that he will need to call us [facility] for his refills. This is not something we are able to initiate on behalf of pts.

## 2019-05-26 NOTE — Telephone Encounter (Signed)
Will await pt returned call before processing his request.

## 2019-05-26 NOTE — Telephone Encounter (Signed)
Patient requests that before sending RX, patient wants to make absolutely sure that he gave our office the correct Pharmacy information/location. Patient will call back to verify Pharmacy information.

## 2019-05-26 NOTE — Telephone Encounter (Signed)
Patient states RX for Insulin Disposable Pump (OMNIPOD 10 PACK) MISC was sent to the wrong PHARM. Patient requests the same as previous RX with refills :  MEDICATION: Insulin Disposable Pump (OMNIPOD 10 PACK) MISC  PHARMACY:  [facility], [address], [place], [place] [address], Ph# [phone] (patient does not have fax#)  IS THIS A 90 DAY SUPPLY : Yes  IS PATIENT OUT OF MEDICATION: Yes  IF NOT; HOW MUCH IS LEFT: 0  LAST APPOINTMENT DATE: @2 /05/2019  NEXT APPOINTMENT DATE:@4 /07/2019  DO WE HAVE YOUR PERMISSION TO LEAVE A DETAILED MESSAGE: Yes  OTHER COMMENTS:    **Let patient know to contact pharmacy at the end of the day to make sure medication is ready. **  ** Please notify patient to allow 48-72 hours to process**  **Encourage patient to contact the pharmacy for refills or they can request refills through [facility]**

## 2019-05-26 NOTE — Telephone Encounter (Signed)
Patient confirmed - pharmacy is:  [facility] [address] [place], [place] [address]  Ph# [phone]

## 2019-05-29 ENCOUNTER — Other Ambulatory Visit: Payer: Self-pay

## 2019-05-29 DIAGNOSIS — E10649 Type 1 diabetes mellitus with hypoglycemia without coma: Secondary | ICD-10-CM

## 2019-05-29 MED ORDER — OMNIPOD 10 PACK MISC: 1.0000 | 3 refills | Status: DC

## 2019-05-29 NOTE — Telephone Encounter (Signed)
Pharmacy  [facility] [place], [place] - [address]  [address] [place], [place] [address]  Phone:  [phone]  Fax:  [phone]    Outpatient Medication Detail   Disp Refills Start End   Insulin Disposable Pump (OMNIPOD 10 PACK) MISC 135 each 3 05/29/2019    Sig - Route: 1 Device by Does not apply route every 36 (thirty-six) hours. E10.65 - Does not apply   Sent to pharmacy as: Insulin Disposable Pump (OMNIPOD 10 PACK) Misc   E-Prescribing Status: Receipt confirmed by pharmacy (05/29/2019 12:55 PM EDT)    Above Rx was already written for q36hrs. Rx has merely been re-sent to [facility]. However, Rx will likely not be processed until CMN received by [facility], completed and signed by Dr. Everardo All and faxed along with other required docs as previously stated.

## 2019-05-29 NOTE — Telephone Encounter (Signed)
Explained to patient what you stated before - patient states there needs to be a new RX written for 36hrs rather than 72hrs? Patient says he just spoke to [facility] and they told him we would have to send it and fax it to them. Their fax is [phone]

## 2019-05-30 ENCOUNTER — Other Ambulatory Visit: Payer: Self-pay

## 2019-05-30 ENCOUNTER — Encounter: Payer: Self-pay | Admitting: Emergency Medicine

## 2019-05-30 ENCOUNTER — Ambulatory Visit
Admission: EM | Admit: 2019-05-30 | Discharge: 2019-05-30 | Disposition: A | Discharge status: Home / Self Care | Payer: Managed Care, Other (non HMO)

## 2019-05-30 DIAGNOSIS — H6123 Impacted cerumen, bilateral: Secondary | ICD-10-CM

## 2019-05-30 NOTE — ED Notes (Signed)
Patient able to ambulate independently  

## 2019-05-30 NOTE — ED Triage Notes (Signed)
Pt presents to [facility] for assessment after using an ear cleaning kit last night.  States he also flushed it last night.  States woke up this morning and could not hear out of his left ear and some mild pain.  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   

## 2019-05-30 NOTE — Discharge Instructions (Addendum)
Return for worsening ear pain, swelling, discharge, bleeding, decreased hearing, development of jaw pain/swelling, fever.  Do NOT use Q-tips as these can cause your ear wax to get stuck, the tips may break off and become a foreign body requiring additional medical care, or puncture your eardrum.  Helpful prevention tip: Use a solution of equal parts isopropyl (rubbing) alcohol and white vinegar (acetic acid) in both ears after swimming. 

## 2019-05-30 NOTE — ED Provider Notes (Signed)
[facility]    CSN: [id number] Arrival date & time: 05/30/19  1627      History   Chief Complaint Chief Complaint  Patient presents with  . Otalgia    HPI David [name] is a 24 y.o. male history diabetes, hypothyroidism presenting for left ear pain with decreased hearing since last night.  Patient endorsing history of cerumen impaction: Typically does irrigations and ear cleaning kits at home.  Did this last night without success.  Endorsing decreased hearing in left ear.  No tinnitus, dizziness, fever, recent illness.  No blood or discharge.   Past Medical History:  Diagnosis Date  . Diabetes mellitus   . Goiter   . Gynecomastia   . Hypoglycemia associated with diabetes (HCC)   . Hypothyroidism, acquired, autoimmune   . Thyroiditis, autoimmune     Patient Active Problem List   Diagnosis Date Noted  . Hypoglycemia unawareness in type 1 diabetes mellitus (HCC) 07/23/2015  . Maladaptive health behaviors affecting medical condition 04/24/2015  . Adjustment reaction of adult life 01/23/2015  . Adjustment reaction to medical therapy 11/08/2014  . Poor sleep pattern 02/21/2013  . Essential hypertension, benign 08/16/2012  . Diabetic peripheral neuropathy (HCC) 08/16/2012  . Hypoglycemia associated with diabetes (HCC) 07/18/2010  . Goiter   . Hypothyroidism, acquired, autoimmune   . Thyroiditis, autoimmune   . Uncontrolled type 1 diabetes mellitus (HCC) 06/17/2010  . CHRONIC RHINITIS 06/11/2008  . DYSPNEA 06/11/2008    History reviewed. No pertinent surgical history.     Home Medications    Prior to Admission medications   Medication Sig Start Date End Date Taking? Authorizing Provider  glucagon (GLUCAGON EMERGENCY) 1 MG injection Inject 1mg  intramuscular as needed for severe Hypoglecemia. Dispense 4= 30 days 08/22/12   08/24/12, MD  glucose blood (FREESTYLE LITE) test strip CHECK BG 10X DAY 01/24/19   01/26/19, MD  insulin aspart  (NOVOLOG) 100 UNIT/ML injection USE UP TO 120 UNITS IN PUMP DX E11.65 04/12/19   06/10/19, MD  Insulin Disposable Pump (OMNIPOD 10 PACK) MISC 1 Device by Does not apply route every 36 (thirty-six) hours. E10.65 05/29/19   05/31/19, MD  levothyroxine (SYNTHROID) 200 MCG tablet Take 1 tablet (200 mcg total) by mouth daily before breakfast. 12/01/18   12/03/18, MD  lisinopril (PRINIVIL,ZESTRIL) 5 MG tablet TAKE 1 TABLET BY MOUTH EVERY DAY 05/28/17   05/30/17, MD  omeprazole (PRILOSEC) 20 MG capsule Take 1 capsule (20 mg total) by mouth daily. 05/31/17   06/02/17, NP    Family History Family History  Problem Relation Age of Onset  . Diabetes Mother   . Hypothyroidism Mother   . Cancer Mother   . Hypertension Mother   . Cancer Brother   . Hyperthyroidism Paternal Grandfather     Social History Social History   Tobacco Use  . Smoking status: Never Smoker  . Smokeless tobacco: Never Used  Substance Use Topics  . Alcohol use: No  . Drug use: No     Allergies   Patient has no known allergies.   Review of Systems As per HPI   Physical Exam Triage Vital Signs ED Triage Vitals  Enc Vitals Group     BP 05/30/19 1637 (!) 145/78     Pulse Rate 05/30/19 1637 77     Resp 05/30/19 1637 16     Temp 05/30/19 1637 97.7 F (36.5 C)     Temp Source 05/30/19 1637 Temporal  SpO2 05/30/19 1637 96 %     Weight --      Height --      Head Circumference --      Peak Flow --      Pain Score 05/30/19 1639 4     Pain Loc --      Pain Edu? --      Excl. in [place]? --    No data found.  Updated Vital Signs BP (!) 145/78 (BP Location: Left Arm)   Pulse 77   Temp 97.7 F (36.5 C) (Temporal)   Resp 16   SpO2 96%   Visual Acuity Right Eye Distance:   Left Eye Distance:   Bilateral Distance:    Right Eye Near:   Left Eye Near:    Bilateral Near:     Physical Exam Constitutional:      General: He is not in acute distress. HENT:     Head: Normocephalic and  atraumatic.     Right Ear: External ear normal. There is impacted cerumen.     Left Ear: External ear normal. There is impacted cerumen.     Ears:     Comments: Negative tragal tenderness bilaterally. S/P bilateral irrigation: EACs, TM normal Eyes:     General: No scleral icterus.    Pupils: Pupils are equal, round, and reactive to light.  Cardiovascular:     Rate and Rhythm: Normal rate.  Pulmonary:     Effort: Pulmonary effort is normal. No respiratory distress.     Breath sounds: No wheezing.  Skin:    Coloration: Skin is not jaundiced or pale.  Neurological:     Mental Status: He is alert and oriented to person, place, and time.      UC Treatments / Results  Labs (all labs ordered are listed, but only abnormal results are displayed) Labs Reviewed - No data to display  EKG   Radiology No results found.  Procedures Procedures (including critical care time)  Medications Ordered in UC Medications - No data to display  Initial Impression / Assessment and Plan / UC Course  I have reviewed the triage vital signs and the nursing notes.  Pertinent labs & imaging results that were available during my care of the patient were reviewed by me and considered in my medical decision making (see chart for details).     Patient afebrile, nontoxic in office today.  Ear irrigation attempted in office: Successful with reassuring repeat exam.  Reviewed ear hygiene and return precautions.  Patient verbalized understanding, agreeable to plan. Final Clinical Impressions(s) / UC Diagnoses   Final diagnoses:  Bilateral impacted cerumen     Discharge Instructions      Return for worsening ear pain, swelling, discharge, bleeding, decreased hearing, development of jaw pain/swelling, fever.  Do NOT use Q-tips as these can cause your ear wax to get stuck, the tips may break off and become a foreign body requiring additional medical care, or puncture your eardrum.  Helpful prevention  tip: Use a solution of equal parts isopropyl (rubbing) alcohol and white vinegar (acetic acid) in both ears after swimming.    ED Prescriptions    None     PDMP not reviewed this encounter.   [place], [place], [place] 06/01/19 1758

## 2019-06-02 ENCOUNTER — Telehealth: Payer: Self-pay

## 2019-06-02 NOTE — Telephone Encounter (Signed)
FAXED DOCUMENTS  Company: [facility]  Document: Rx for Omnipod and supplies  Other records requested: None requested  All above requested information has been faxed successfully to [facility] listed above. Documents and fax confirmation have been placed in the faxed file for future reference.

## 2019-06-12 ENCOUNTER — Encounter: Payer: Self-pay | Admitting: Endocrinology

## 2019-06-12 ENCOUNTER — Other Ambulatory Visit: Payer: Self-pay

## 2019-06-12 ENCOUNTER — Telehealth: Payer: Self-pay | Admitting: Nutrition

## 2019-06-12 ENCOUNTER — Ambulatory Visit (INDEPENDENT_AMBULATORY_CARE_PROVIDER_SITE_OTHER): Payer: Managed Care, Other (non HMO) | Admitting: Endocrinology

## 2019-06-12 VITALS — BP 126/80 | HR 79 | Ht 72.0 in | Wt 260.0 lb

## 2019-06-12 DIAGNOSIS — E10649 Type 1 diabetes mellitus with hypoglycemia without coma: Secondary | ICD-10-CM

## 2019-06-12 LAB — POCT GLYCOSYLATED HEMOGLOBIN (HGB A1C): Hemoglobin A1C: 9 % — AB (ref 4.0–5.6)

## 2019-06-12 NOTE — Patient Instructions (Addendum)
check your blood sugar twice a day, to calibrate your monitor.  vary the time of day when you check, between before the 3 meals, and at bedtime.  also check if you have symptoms of your blood sugar being too high or too low.  please keep a record of the readings and bring it to your next appointment here (or you can bring the meter itself).  You can write it on any piece of paper.  please call us sooner if your blood sugar goes below 70, or if you have a lot of readings over 200.  Please take the these settings:  basal rate of 4.8 units/hr, noon-10 PM, and 2 units/hr, at all other times.  correction bolus (which some people call "sensitivity," or "insulin sensitivity ratio," or just "isr") of 1 unit for each 50 by which your glucose exceeds 100.   On this type of pump schedule, you should eat meals on a regular schedule (especially lunch).  If a meal is missed or significantly delayed, your blood sugar could go low.   Please see Bonita Quin, to consider the [employer].  Please come back for a follow-up appointment in 2 months.

## 2019-06-12 NOTE — Telephone Encounter (Signed)
Discussed with patient how to upgrade to [facility] system.  He will contact OmniPod customer service to start paperwork for this.  Telephone number given.  He was told that the Sharilyn Sites will required new pods, and will need a prescription from Dr. Everardo All.  He reported good understanding of this with no questions.

## 2019-06-12 NOTE — Progress Notes (Signed)
Subjective:    Patient ID: David [name], male    DOB: 1995/04/03, 24 y.o.   MRN: [id number]  HPI Pt returns for f/u of diabetes mellitus: DM type: 1 Dx'ed: 2979 Complications: none Therapy: insulin since dx  DKA: never Severe hypoglycemia: never Pancreatitis: never. 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 Other: he uses an Omnipod pump, and Dexcom G5 continuous glucose monitor;   He says glucose at work is similar to off days.  Higher daytime basal does not start until noon, because breakfast is usually minimal.    Interval history:  Pt says he ran out of Omnipods 2 weeks ago, as he changes every 36 hrs.  He changed to intermitt Novolog injections.  Since then, cbg's have been variable.  He now has enough for 6 more weeks.  He now gets Omnipod from [employer].  Meter is downloaded today, and the printout is scanned into the record.  There are 2 cbg's: (326 and 291). Past Medical History:  Diagnosis Date  . Diabetes mellitus   . Goiter   . Gynecomastia   . Hypoglycemia associated with diabetes ([place])   . Hypothyroidism, acquired, autoimmune   . Thyroiditis, autoimmune     No past surgical history on file.  Social History   Socioeconomic History  . Marital status: Single    Spouse name: Not on file  . Number of children: Not on file  . Years of education: Not on file  . Highest education level: Not on file  Occupational History  . Not on file  Tobacco Use  . Smoking status: Never Smoker  . Smokeless tobacco: Never Used  Substance and Sexual Activity  . Alcohol use: No  . Drug use: No  . Sexual activity: Yes    Partners: Female  Other Topics Concern  . Not on file  Social History Narrative  . Not on file   Social Determinants of Health   Financial Resource Strain:   . Difficulty of Paying Living Expenses:   Food Insecurity:   . Worried About Charity fundraiser in the Last Year:   . Arboriculturist in the Last Year:    Transportation Needs:   . Film/video editor (Medical):   Marland Kitchen Lack of Transportation (Non-Medical):   Physical Activity:   . Days of Exercise per Week:   . Minutes of Exercise per Session:   Stress:   . Feeling of Stress :   Social Connections:   . Frequency of Communication with Friends and Family:   . Frequency of Social Gatherings with Friends and Family:   . Attends Religious Services:   . Active Member of Clubs or Organizations:   . Attends Archivist Meetings:   Marland Kitchen Marital Status:   Intimate Partner Violence:   . Fear of Current or Ex-Partner:   . Emotionally Abused:   Marland Kitchen Physically Abused:   . Sexually Abused:     Current Outpatient Medications on File Prior to Visit  Medication Sig Dispense Refill  . glucagon (GLUCAGON EMERGENCY) 1 MG injection Inject 1mg  intramuscular as needed for severe Hypoglecemia. Dispense 4= 30 days 4 each 4  . glucose blood (FREESTYLE LITE) test strip CHECK BG 10X DAY 200 strip 10  . insulin aspart (NOVOLOG) 100 UNIT/ML injection USE UP TO 120 UNITS IN PUMP DX E11.65 40 mL 2  . Insulin Disposable Pump (OMNIPOD 10 PACK) MISC 1 Device by Does not  apply route every 36 (thirty-six) hours. E10.65 135 each 3  . levothyroxine (SYNTHROID) 200 MCG tablet Take 1 tablet (200 mcg total) by mouth daily before breakfast. 90 tablet 3  . lisinopril (PRINIVIL,ZESTRIL) 5 MG tablet TAKE 1 TABLET BY MOUTH EVERY DAY 30 tablet 3  . omeprazole (PRILOSEC) 20 MG capsule Take 1 capsule (20 mg total) by mouth daily. 30 capsule 0   No current facility-administered medications on file prior to visit.    No Known Allergies  Family History  Problem Relation Age of Onset  . Diabetes Mother   . Hypothyroidism Mother   . Cancer Mother   . Hypertension Mother   . Cancer Brother   . Hyperthyroidism Paternal Grandfather     BP 126/80   Pulse 79   Ht 6' (1.829 m)   Wt 260 lb (117.9 kg)   SpO2 100%   BMI 35.26 kg/m   Review of Systems Denies n/v/sob      Objective:   Physical Exam VITAL SIGNS:  See vs page GENERAL: no distress Pulses: dorsalis pedis intact bilat.   MSK: no deformity of the feet CV: no leg edema Skin:  no ulcer on the feet.  normal color and temp on the feet. Neuro: sensation is intact to touch on the feet.     Lab Results  Component Value Date   HGBA1C 9.0 (A) 06/12/2019       Assessment & Plan:  Type 1 DM: worse.  Device problem: we discussed. Ref CDE.   Patient Instructions  check your blood sugar twice a day, to calibrate your monitor.  vary the time of day when you check, between before the 3 meals, and at bedtime.  also check if you have symptoms of your blood sugar being too high or too low.  please keep a record of the readings and bring it to your next appointment here (or you can bring the meter itself).  You can write it on any piece of paper.  please call us sooner if your blood sugar goes below 70, or if you have a lot of readings over 200.  Please take the these settings:  basal rate of 4.8 units/hr, noon-10 PM, and 2 units/hr, at all other times.  correction bolus (which some people call "sensitivity," or "insulin sensitivity ratio," or just "isr") of 1 unit for each 50 by which your glucose exceeds 100.   On this type of pump schedule, you should eat meals on a regular schedule (especially lunch).  If a meal is missed or significantly delayed, your blood sugar could go low.   Please see Bonita Quin, to consider the [employer].  Please come back for a follow-up appointment in 2 months.

## 2019-07-19 ENCOUNTER — Telehealth: Payer: Self-pay | Admitting: Endocrinology

## 2019-07-19 ENCOUNTER — Other Ambulatory Visit: Payer: Self-pay

## 2019-07-19 NOTE — Telephone Encounter (Signed)
Patient called stating he had a physical done through work and wanted to let Dr Everardo All know that his TSH level was 43.2. Please advise [phone]

## 2019-07-19 NOTE — Telephone Encounter (Signed)
LVM requesting returned call 

## 2019-07-19 NOTE — Telephone Encounter (Signed)
You are already on a high amount of thyroid medication.  Do you ever miss it?  It is best to never miss the medication.  However, if you do miss it, next best is to double up the next time.   You should take the medication on an empty stomach, aside from other meds.  Let's recheck the blood test next time

## 2019-07-19 NOTE — Telephone Encounter (Signed)
Please review and advise.

## 2019-07-19 NOTE — Telephone Encounter (Signed)
Spoke with patient and he stated that he recently had a physical through his employer and labs done through [facility]. Pt reports the TSH level was high and his PCP advised that he should contact this office and have Dr. Everardo All advise on how to proceed.  Pt reports that he does occasionally take his thyroid medication with milk or calcium containing supplements. He is no longer taking Omeprazole because it was short term. Pt sometimes does not wait before eating after taking the medication. He also states that he rarely misses a dose.  Pt was informed that knowing all of this, a message will be sent to Dr. Everardo All and have him advise on how best to proceed henceforth.

## 2019-07-19 NOTE — Telephone Encounter (Signed)
Even if this was a mistake by the lab, the safest thing to do is to recheck this at next OV here.

## 2019-07-20 NOTE — Telephone Encounter (Signed)
Called pt and informed of Dr. [name]'s response. Verbalized acceptance and understanding. 

## 2019-08-10 ENCOUNTER — Other Ambulatory Visit: Payer: Self-pay

## 2019-08-10 ENCOUNTER — Encounter: Payer: Self-pay | Admitting: Endocrinology

## 2019-08-10 ENCOUNTER — Ambulatory Visit (INDEPENDENT_AMBULATORY_CARE_PROVIDER_SITE_OTHER): Payer: Managed Care, Other (non HMO) | Admitting: Endocrinology

## 2019-08-10 VITALS — BP 120/70 | HR 76 | Ht 72.0 in | Wt 263.0 lb

## 2019-08-10 DIAGNOSIS — E10649 Type 1 diabetes mellitus with hypoglycemia without coma: Secondary | ICD-10-CM

## 2019-08-10 LAB — POCT GLYCOSYLATED HEMOGLOBIN (HGB A1C): Hemoglobin A1C: 8.8 % — AB (ref 4.0–5.6)

## 2019-08-10 LAB — TSH: TSH: 0.57 u[IU]/mL (ref 0.35–4.50)

## 2019-08-10 MED ORDER — DEXCOM G5 MOBILE TRANSMITTER MISC: 1.0000 | Freq: Once | 3 refills | Status: AC

## 2019-08-10 NOTE — Patient Instructions (Addendum)
check your blood sugar twice a day, to calibrate your monitor.  vary the time of day when you check, between before the 3 meals, and at bedtime.  also check if you have symptoms of your blood sugar being too high or too low.  please keep a record of the readings and bring it to your next appointment here (or you can bring the meter itself).  You can write it on any piece of paper.  please call us sooner if your blood sugar goes below 70, or if you have a lot of readings over 200.  Please take the these settings:  basal rate of 4.8 units/hr, noon-10 PM, and 2.1 units/hr, overnight.   correction bolus (which some people call "sensitivity," or "insulin sensitivity ratio," or just "isr") of 1 unit for each 50 by which your glucose exceeds 100.   On this type of pump schedule, you should eat meals on a regular schedule (especially lunch).  If a meal is missed or significantly delayed, your blood sugar could go low.   I have sent a prescription to your pharmacy, for the transmitter.   Please come back for a follow-up appointment in late July.

## 2019-08-10 NOTE — Progress Notes (Signed)
Subjective:    Patient ID: David [name], male    DOB: 1995-08-01, 24 y.o.   MRN: [id number]  HPI Pt returns for f/u of diabetes mellitus: DM type: 1 Dx'ed: 2951 Complications: none Therapy: insulin since dx  DKA: never Severe hypoglycemia: never Pancreatitis: never. 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 Other: he uses an Omnipod pump, and Dexcom G5 continuous glucose monitor;   He says glucose at work is similar to off days.  Higher daytime basal does not start until noon, because breakfast is usually minimal.    Interval history:  He takes these settings: basal rate of 4.8 units/hr, noon-10 PM, and 2 units/hr, at all other times.  correction bolus (which some people call "sensitivity," or "insulin sensitivity ratio," or just "isr") of 1 unit for each 50 by which your glucose exceeds 100.  Meter is downloaded today, and the printout is scanned into the record.  There are only 2 cbg's: (288 (4PM), and 44 (11PM)). Past Medical History:  Diagnosis Date  . Diabetes mellitus   . Goiter   . Gynecomastia   . Hypoglycemia associated with diabetes ([place])   . Hypothyroidism, acquired, autoimmune   . Thyroiditis, autoimmune     No past surgical history on file.  Social History   Socioeconomic History  . Marital status: Single    Spouse name: Not on file  . Number of children: Not on file  . Years of education: Not on file  . Highest education level: Not on file  Occupational History  . Not on file  Tobacco Use  . Smoking status: Never Smoker  . Smokeless tobacco: Never Used  Substance and Sexual Activity  . Alcohol use: No  . Drug use: No  . Sexual activity: Yes    Partners: Female  Other Topics Concern  . Not on file  Social History Narrative  . Not on file   Social Determinants of Health   Financial Resource Strain:   . Difficulty of Paying Living Expenses:   Food Insecurity:   . Worried About Charity fundraiser in the Last Year:   .  Arboriculturist in the Last Year:   Transportation Needs:   . Film/video editor (Medical):   Marland Kitchen Lack of Transportation (Non-Medical):   Physical Activity:   . Days of Exercise per Week:   . Minutes of Exercise per Session:   Stress:   . Feeling of Stress :   Social Connections:   . Frequency of Communication with Friends and Family:   . Frequency of Social Gatherings with Friends and Family:   . Attends Religious Services:   . Active Member of Clubs or Organizations:   . Attends Archivist Meetings:   Marland Kitchen Marital Status:   Intimate Partner Violence:   . Fear of Current or Ex-Partner:   . Emotionally Abused:   Marland Kitchen Physically Abused:   . Sexually Abused:     Current Outpatient Medications on File Prior to Visit  Medication Sig Dispense Refill  . glucagon (GLUCAGON EMERGENCY) 1 MG injection Inject 1mg  intramuscular as needed for severe Hypoglecemia. Dispense 4= 30 days 4 each 4  . glucose blood (FREESTYLE LITE) test strip CHECK BG 10X DAY 200 strip 10  . insulin aspart (NOVOLOG) 100 UNIT/ML injection USE UP TO 120 UNITS IN PUMP DX E11.65 40 mL 2  . Insulin Disposable Pump (OMNIPOD 10 PACK) MISC 1 Device by Does not apply  route every 36 (thirty-six) hours. E10.65 135 each 3  . levothyroxine (SYNTHROID) 200 MCG tablet Take 1 tablet (200 mcg total) by mouth daily before breakfast. 90 tablet 3   No current facility-administered medications on file prior to visit.    No Known Allergies  Family History  Problem Relation Age of Onset  . Diabetes Mother   . Hypothyroidism Mother   . Cancer Mother   . Hypertension Mother   . Cancer Brother   . Hyperthyroidism Paternal Grandfather     BP 120/70 (BP Location: Left Arm, Patient Position: Sitting, Cuff Size: Large)   Pulse 76   Ht 6' (1.829 m)   Wt 263 lb (119.3 kg)   SpO2 97%   BMI 35.67 kg/m    Review of Systems Denies LOC    Objective:   Physical Exam VITAL SIGNS:  See vs page GENERAL: no distress Pulses:  dorsalis pedis intact bilat.   MSK: no deformity of the feet CV: no leg edema Skin:  no ulcer on the feet.  normal color and temp on the feet. Neuro: sensation is intact to touch on the feet.    Lab Results  Component Value Date   HGBA1C 8.8 (A) 08/10/2019   outside test results are reviewed: TSH=43    Assessment & Plan:  Type 1 DM: he needs increased rx.  We discussed.  He chooses to increase overnight basal.  He declines ref CDE.   Noncompliance with cbg recording.  In this setting, we have to increase insulin cautiously Hypothyroidism: worse: recheck today.    Patient Instructions  check your blood sugar twice a day, to calibrate your monitor.  vary the time of day when you check, between before the 3 meals, and at bedtime.  also check if you have symptoms of your blood sugar being too high or too low.  please keep a record of the readings and bring it to your next appointment here (or you can bring the meter itself).  You can write it on any piece of paper.  please call us sooner if your blood sugar goes below 70, or if you have a lot of readings over 200.  Please take the these settings:  basal rate of 4.8 units/hr, noon-10 PM, and 2.1 units/hr, overnight.   correction bolus (which some people call "sensitivity," or "insulin sensitivity ratio," or just "isr") of 1 unit for each 50 by which your glucose exceeds 100.   On this type of pump schedule, you should eat meals on a regular schedule (especially lunch).  If a meal is missed or significantly delayed, your blood sugar could go low.   I have sent a prescription to your pharmacy, for the transmitter.   Please come back for a follow-up appointment in late July.

## 2019-08-14 ENCOUNTER — Other Ambulatory Visit: Payer: Self-pay

## 2019-09-12 ENCOUNTER — Other Ambulatory Visit: Payer: Self-pay | Admitting: Endocrinology

## 2019-09-12 ENCOUNTER — Other Ambulatory Visit: Payer: Self-pay

## 2019-09-12 ENCOUNTER — Telehealth: Payer: Self-pay

## 2019-09-12 DIAGNOSIS — E10649 Type 1 diabetes mellitus with hypoglycemia without coma: Secondary | ICD-10-CM

## 2019-09-12 MED ORDER — INSULIN LISPRO 100 UNIT/ML ~~LOC~~ SOLN: SUBCUTANEOUS | 0 refills | Status: DC

## 2019-09-12 NOTE — Telephone Encounter (Signed)
Received notification from [facility] stating Novolog not covered by pt plan. Reviewed [employer] benefits changes and it appear as of 09/07/19, Novolog is no longer covered as formulary but rather covers Humalog. Discussed with Dr. Everardo All. Advised to change to Humalog rather than proceed with PA for Novolog. New Rx sent as ordered by Dr. Everardo All.

## 2019-09-19 ENCOUNTER — Telehealth: Payer: Self-pay | Admitting: Endocrinology

## 2019-09-19 NOTE — Telephone Encounter (Signed)
Rich with [facility] ph# [phone] called on behalf patient re: Request from [facility] for Omnipod RX to be increased from 30 for 90 days to 50 for 90 days. Rich requests to be called at the ph# listed above once the above request has been completed so that he can update status on this issue.

## 2019-09-22 NOTE — Telephone Encounter (Signed)
[employer] calling to follow up on his call from the other day - patient needing his Omnipod Rx increased and Rich with [facility] requested to be called at [phone] to have a status update on when we could have that called in.

## 2019-09-22 NOTE — Telephone Encounter (Signed)
Paperwork will be completed within the policy guidelines for paperwork.

## 2019-09-25 NOTE — Telephone Encounter (Signed)
[employer] calling again regarding the new omnipod Rx - [facility] said it needs to be 5 boxes for 90 days - [facility] received the Rx today but it was for the old amount.

## 2019-09-26 NOTE — Telephone Encounter (Signed)
This form cannot be located. Requested from Omnip[employer] that it be resent.

## 2019-09-26 NOTE — Telephone Encounter (Signed)
Patient requests to be called at ph# [phone] re: status of RX of dosage increase for Omnipod to be sent to [facility] (5 boxes for 90 days)

## 2019-09-28 ENCOUNTER — Telehealth: Payer: Self-pay

## 2019-09-28 NOTE — Telephone Encounter (Signed)
Insulin pump and supply order form filled out and faxed to [facility].

## 2019-10-02 ENCOUNTER — Encounter: Payer: Self-pay | Admitting: Endocrinology

## 2019-10-02 ENCOUNTER — Other Ambulatory Visit: Payer: Self-pay

## 2019-10-02 ENCOUNTER — Ambulatory Visit (INDEPENDENT_AMBULATORY_CARE_PROVIDER_SITE_OTHER): Payer: Managed Care, Other (non HMO) | Admitting: Endocrinology

## 2019-10-02 VITALS — BP 140/90 | HR 71 | Ht 72.0 in | Wt 261.8 lb

## 2019-10-02 DIAGNOSIS — E10649 Type 1 diabetes mellitus with hypoglycemia without coma: Secondary | ICD-10-CM | POA: Diagnosis not present

## 2019-10-02 LAB — POCT GLYCOSYLATED HEMOGLOBIN (HGB A1C): Hemoglobin A1C: 8.8 % — AB (ref 4.0–5.6)

## 2019-10-02 NOTE — Progress Notes (Signed)
Subjective:    Patient ID: David [name], male    DOB: 1996-01-08, 24 y.o.   MRN: [id number]  HPI Pt returns for f/u of diabetes mellitus: DM type: 1 Dx'ed: 2007 Complications: none Therapy: insulin since dx  DKA: never Severe hypoglycemia: never Pancreatitis: never. 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 Other: he uses an Omnipod pump, and Dexcom G5 continuous glucose monitor;   He says glucose at work is similar to off days.  Higher daytime basal does not start until noon, because breakfast is usually minimal.    Interval history:  He takes these settings: basal rate of 4.8 units/hr, noon-10 PM, and 2.1 units/hr, overnight.   correction bolus (which some people call "sensitivity," or "insulin sensitivity ratio," or just "isr") of 1 unit for each 50 by which your glucose exceeds 100.  He changed back to old medtronic pump, due to difficulty getting Omnipods.  He is not using continuous glucose monitor sensors.  He says he was changing Omnipods every 2 days.    no cbg record, but states cbg's vary from 65-200's.  There is no trend throughout the day.  We are unable to download pump. Past Medical History:  Diagnosis Date  . Diabetes mellitus   . Goiter   . Gynecomastia   . Hypoglycemia associated with diabetes ([facility])   . Hypothyroidism, acquired, autoimmune   . Thyroiditis, autoimmune     No past surgical history on file.  Social History   Socioeconomic History  . Marital status: Single    Spouse name: Not on file  . Number of children: Not on file  . Years of education: Not on file  . Highest education level: Not on file  Occupational History  . Not on file  Tobacco Use  . Smoking status: Never Smoker  . Smokeless tobacco: Never Used  Substance and Sexual Activity  . Alcohol use: No  . Drug use: No  . Sexual activity: Yes    Partners: Female  Other Topics Concern  . Not on file  Social History Narrative  . Not on file   Social  Determinants of Health   Financial Resource Strain:   . Difficulty of Paying Living Expenses:   Food Insecurity:   . Worried About Programme researcher, broadcasting/film/video in the Last Year:   . Barista in the Last Year:   Transportation Needs:   . Freight forwarder (Medical):   Marland Kitchen Lack of Transportation (Non-Medical):   Physical Activity:   . Days of Exercise per Week:   . Minutes of Exercise per Session:   Stress:   . Feeling of Stress :   Social Connections:   . Frequency of Communication with Friends and Family:   . Frequency of Social Gatherings with Friends and Family:   . Attends Religious Services:   . Active Member of Clubs or Organizations:   . Attends Banker Meetings:   Marland Kitchen Marital Status:   Intimate Partner Violence:   . Fear of Current or Ex-Partner:   . Emotionally Abused:   Marland Kitchen Physically Abused:   . Sexually Abused:     Current Outpatient Medications on File Prior to Visit  Medication Sig Dispense Refill  . glucagon (GLUCAGON EMERGENCY) 1 MG injection Inject 1mg  intramuscular as needed for severe Hypoglecemia. Dispense 4= 30 days 4 each 4  . insulin lispro (HUMALOG) 100 UNIT/ML injection USE UP TO 120 UNITS IN PUMP DX E11.65  40 mL 0  . levothyroxine (SYNTHROID) 200 MCG tablet Take 1 tablet (200 mcg total) by mouth daily before breakfast. 90 tablet 3  . glucose blood (FREESTYLE LITE) test strip CHECK BG 10X DAY (Patient not taking: Reported on 10/02/2019) 200 strip 10  . Insulin Disposable Pump (OMNIPOD 10 PACK) MISC 1 Device by Does not apply route every 36 (thirty-six) hours. E10.65 (Patient not taking: Reported on 10/02/2019) 135 each 3   No current facility-administered medications on file prior to visit.    No Known Allergies  Family History  Problem Relation Age of Onset  . Diabetes Mother   . Hypothyroidism Mother   . Cancer Mother   . Hypertension Mother   . Cancer Brother   . Hyperthyroidism Paternal Grandfather     BP (!) 140/90   Pulse 71    Ht 6' (1.829 m)   Wt (!) 261 lb 12.8 oz (118.8 kg)   SpO2 96%   BMI 35.51 kg/m   Review of Systems He denies hypoglycemia.      Objective:   Physical Exam VITAL SIGNS:  See vs page GENERAL: no distress Pulses: dorsalis pedis intact bilat.   MSK: no deformity of the feet CV: no leg edema Skin:  no ulcer on the feet.  normal color and temp on the feet. Neuro: sensation is intact to touch on the feet  Lab Results  Component Value Date   HGBA1C 8.8 (A) 10/02/2019       Assessment & Plan:  Type 1 DM: he needs increased rx.  Hypoglycemia, due to insulin: this limits aggressiveness of glycemic control.  HTN: is noted today.    Patient Instructions  Your blood pressure is high today.  Please see your primary care provider soon, to have it rechecked check your blood sugar twice a day, to calibrate your monitor.  vary the time of day when you check, between before the 3 meals, and at bedtime.  also check if you have symptoms of your blood sugar being too high or too low.  please keep a record of the readings and bring it to your next appointment here (or you can bring the meter itself).  You can write it on any piece of paper.  please call us sooner if your blood sugar goes below 70, or if you have a lot of readings over 200.  Please take the these settings:  basal rate of 4.9 units/hr, noon-10 PM, and 2.2 units/hr, overnight.   correction bolus (which some people call "sensitivity," or "insulin sensitivity ratio," or just "isr") of 1 unit for each 50 by which your glucose exceeds 100.   On this type of pump schedule, you should eat meals on a regular schedule (especially lunch).  If a meal is missed or significantly delayed, your blood sugar could go low.   We are updating Omnipod form to show you use 1 every 2 days.

## 2019-10-02 NOTE — Patient Instructions (Addendum)
Your blood pressure is high today.  Please see your primary care provider soon, to have it rechecked check your blood sugar twice a day, to calibrate your monitor.  vary the time of day when you check, between before the 3 meals, and at bedtime.  also check if you have symptoms of your blood sugar being too high or too low.  please keep a record of the readings and bring it to your next appointment here (or you can bring the meter itself).  You can write it on any piece of paper.  please call us sooner if your blood sugar goes below 70, or if you have a lot of readings over 200.  Please take the these settings:  basal rate of 4.9 units/hr, noon-10 PM, and 2.2 units/hr, overnight.   correction bolus (which some people call "sensitivity," or "insulin sensitivity ratio," or just "isr") of 1 unit for each 50 by which your glucose exceeds 100.   On this type of pump schedule, you should eat meals on a regular schedule (especially lunch).  If a meal is missed or significantly delayed, your blood sugar could go low.   We are updating Omnipod form to show you use 1 every 2 days.

## 2019-12-23 ENCOUNTER — Other Ambulatory Visit: Payer: Self-pay | Admitting: Endocrinology

## 2020-01-03 ENCOUNTER — Ambulatory Visit (INDEPENDENT_AMBULATORY_CARE_PROVIDER_SITE_OTHER): Payer: Managed Care, Other (non HMO) | Admitting: Endocrinology

## 2020-01-03 ENCOUNTER — Other Ambulatory Visit: Payer: Self-pay

## 2020-01-03 VITALS — BP 130/88 | HR 78 | Ht 74.0 in | Wt 274.8 lb

## 2020-01-03 DIAGNOSIS — E10649 Type 1 diabetes mellitus with hypoglycemia without coma: Secondary | ICD-10-CM | POA: Diagnosis not present

## 2020-01-03 LAB — POCT GLYCOSYLATED HEMOGLOBIN (HGB A1C): Hemoglobin A1C: 8.3 % — AB (ref 4.0–5.6)

## 2020-01-03 MED ORDER — INSULIN LISPRO 100 UNIT/ML ~~LOC~~ SOLN: SUBCUTANEOUS | 3 refills | Status: DC

## 2020-01-03 NOTE — Patient Instructions (Addendum)
check your blood sugar twice a day, to calibrate your monitor.  vary the time of day when you check, between before the 3 meals, and at bedtime.  also check if you have symptoms of your blood sugar being too high or too low.  please keep a record of the readings and bring it to your next appointment here (or you can bring the meter itself).  You can write it on any piece of paper.  please call us sooner if your blood sugar goes below 70, or if you have a lot of readings over 200.  Please take the these settings:  basal rate of 5 units/hr, noon-10 PM, and 2.2 units/hr, overnight.   correction bolus (which some people call "sensitivity," or "insulin sensitivity ratio," or just "isr") of 1 unit for each 50 by which your glucose exceeds 100.   On this type of pump schedule, you should eat meals on a regular schedule (especially lunch).  If a meal is missed or significantly delayed, your blood sugar could go low.

## 2020-01-03 NOTE — Progress Notes (Signed)
Subjective:    Patient ID: David [name], male    DOB: [birth year], 24 y.o.   MRN: [id number]  HPI Pt returns for f/u of diabetes mellitus: DM type: 1 Dx'ed: 2007 Complications: none Therapy: insulin since dx  DKA: never Severe hypoglycemia: never Pancreatitis: never. 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 Other: he uses an Omnipod pumps, and Dexcom G5 continuous glucose monitor (but he has stopped using continuous glucose monitor);   He says glucose at work is similar to off days.  Higher daytime basal does not start until noon, because breakfast is usually minimal.    Interval history:  He takes these settings: basal rate of 4.9 units/hr, noon-10 PM, and 2.2 units/hr, overnight.   correction bolus (which some people call "sensitivity," or "insulin sensitivity ratio," or just "isr") of 1 unit for each 50 by which your glucose exceeds 100.  TDD is 74 units (98% basal) no cbg record, but states cbg's vary from 55-425.  It is in general higher as the day goes on.  He has mild hypoglycemia approx once per week.     Past Medical History:  Diagnosis Date  . Diabetes mellitus   . Goiter   . Gynecomastia   . Hypoglycemia associated with diabetes (HCC)   . Hypothyroidism, acquired, autoimmune   . Thyroiditis, autoimmune     No past surgical history on file.  Social History   Socioeconomic History  . Marital status: Single    Spouse name: Not on file  . Number of children: Not on file  . Years of education: Not on file  . Highest education level: Not on file  Occupational History  . Not on file  Tobacco Use  . Smoking status: Never Smoker  . Smokeless tobacco: Never Used  Substance and Sexual Activity  . Alcohol use: No  . Drug use: No  . Sexual activity: Yes    Partners: Female  Other Topics Concern  . Not on file  Social History Narrative  . Not on file   Social Determinants of Health   Financial Resource Strain:   . Difficulty of Paying  Living Expenses: Not on file  Food Insecurity:   . Worried About Programme researcher, broadcasting/film/video in the Last Year: Not on file  . Ran Out of Food in the Last Year: Not on file  Transportation Needs:   . Lack of Transportation (Medical): Not on file  . Lack of Transportation (Non-Medical): Not on file  Physical Activity:   . Days of Exercise per Week: Not on file  . Minutes of Exercise per Session: Not on file  Stress:   . Feeling of Stress : Not on file  Social Connections:   . Frequency of Communication with Friends and Family: Not on file  . Frequency of Social Gatherings with Friends and Family: Not on file  . Attends Religious Services: Not on file  . Active Member of Clubs or Organizations: Not on file  . Attends Banker Meetings: Not on file  . Marital Status: Not on file  Intimate Partner Violence:   . Fear of Current or Ex-Partner: Not on file  . Emotionally Abused: Not on file  . Physically Abused: Not on file  . Sexually Abused: Not on file    Current Outpatient Medications on File Prior to Visit  Medication Sig Dispense Refill  . glucagon (GLUCAGON EMERGENCY) 1 MG injection Inject 1mg  intramuscular as needed for severe  Hypoglecemia. Dispense 4= 30 days 4 each 4  . glucose blood (FREESTYLE LITE) test strip CHECK BG 10X DAY 200 strip 10  . Insulin Disposable Pump (OMNIPOD 10 PACK) MISC 1 Device by Does not apply route every 36 (thirty-six) hours. E10.65 135 each 3  . levothyroxine (SYNTHROID) 200 MCG tablet TAKE 1 TABLET (200 MCG TOTAL) BY MOUTH DAILY BEFORE BREAKFAST. 90 tablet 3   No current facility-administered medications on file prior to visit.    No Known Allergies  Family History  Problem Relation Age of Onset  . Diabetes Mother   . Hypothyroidism Mother   . Cancer Mother   . Hypertension Mother   . Cancer Brother   . Hyperthyroidism Paternal Grandfather     BP 130/88 (BP Location: Right Arm, Patient Position: Sitting, Cuff Size: Large)   Pulse 78    Ht 6\' 2"  (1.88 m)   Wt 274 lb 12.8 oz (124.6 kg)   SpO2 97%   BMI 35.28 kg/m    Review of Systems Denies LOC    Objective:   Physical Exam VITAL SIGNS:  See vs page GENERAL: no distress Pulses: dorsalis pedis intact bilat.   MSK: no deformity of the feet CV: no leg edema Skin:  no ulcer on the feet.  normal color and temp on the feet. Neuro: sensation is intact to touch on the feet  Lab Results  Component Value Date   HGBA1C 8.3 (A) 01/03/2020        Assessment & Plan:  Type 1 DM: uncontrolled.   Hypoglycemia, due to insulin: this limits aggressiveness of glycemic control  Patient Instructions  check your blood sugar twice a day, to calibrate your monitor.  vary the time of day when you check, between before the 3 meals, and at bedtime.  also check if you have symptoms of your blood sugar being too high or too low.  please keep a record of the readings and bring it to your next appointment here (or you can bring the meter itself).  You can write it on any piece of paper.  please call 01/05/2020 sooner if your blood sugar goes below 70, or if you have a lot of readings over 200.  Please take the these settings:  basal rate of 5 units/hr, noon-10 PM, and 2.2 units/hr, overnight.   correction bolus (which some people call "sensitivity," or "insulin sensitivity ratio," or just "isr") of 1 unit for each 50 by which your glucose exceeds 100.   On this type of pump schedule, you should eat meals on a regular schedule (especially lunch).  If a meal is missed or significantly delayed, your blood sugar could go low.

## 2020-03-28 ENCOUNTER — Encounter: Payer: Self-pay | Admitting: Endocrinology

## 2020-03-28 ENCOUNTER — Ambulatory Visit (INDEPENDENT_AMBULATORY_CARE_PROVIDER_SITE_OTHER): Payer: Managed Care, Other (non HMO) | Admitting: Endocrinology

## 2020-03-28 ENCOUNTER — Other Ambulatory Visit: Payer: Self-pay

## 2020-03-28 VITALS — BP 132/80 | HR 77 | Ht 74.0 in | Wt 275.6 lb

## 2020-03-28 DIAGNOSIS — E10649 Type 1 diabetes mellitus with hypoglycemia without coma: Secondary | ICD-10-CM | POA: Diagnosis not present

## 2020-03-28 LAB — POCT GLYCOSYLATED HEMOGLOBIN (HGB A1C): Hemoglobin A1C: 9 % — AB (ref 4.0–5.6)

## 2020-03-28 NOTE — Patient Instructions (Addendum)
check your blood sugar twice a day, to calibrate your monitor.  vary the time of day when you check, between before the 3 meals, and at bedtime.  also check if you have symptoms of your blood sugar being too high or too low.  please keep a record of the readings and bring it to your next appointment here (or you can bring the meter itself).  You can write it on any piece of paper.  please call us sooner if your blood sugar goes below 70, or if you have a lot of readings over 200.  Please take the these settings:  basal rate of 5.5 units/hr, noon-10 PM, and 2.2 units/hr, overnight.   correction bolus (which some people call "sensitivity," or "insulin sensitivity ratio," or just "isr") of 1 unit for each 50 by which your glucose exceeds 100.   On this type of pump schedule, you should eat meals on a regular schedule (especially lunch).  If a meal is missed or significantly delayed, your blood sugar could go low.   Please come back for a follow-up appointment in 2 months.

## 2020-03-28 NOTE — Progress Notes (Signed)
Subjective:    Patient ID: David [name], male    DOB: 1995-03-23, 25 y.o.   MRN: [id number]  HPI Pt returns for f/u of diabetes mellitus: DM type: 1 Dx'ed: 2007 Complications: none Therapy: insulin since dx  DKA: never Severe hypoglycemia: never Pancreatitis: never. 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 Other: he uses an Omnipod pumps; he stopped using continuous glucose monitor; he says glucose at work is similar to off days.  Higher daytime basal does not start until noon, because breakfast is usually minimal.    Interval history:  He takes these settings: basal rate of 5 units/hr, noon-10 PM, and 2.2 units/hr, overnight.   correction bolus (which some people call "sensitivity," or "insulin sensitivity ratio," or just "isr") of 1 unit for each 50 by which your glucose exceeds 100.  TDD is 81 units (92% basal) Meter is downloaded today, and the printout is scanned into the record.  There are only 2 cbg's (376 and 580).  He still has mild hypoglycemia approx once per week.  Past Medical History:  Diagnosis Date  . Diabetes mellitus   . Goiter   . Gynecomastia   . Hypoglycemia associated with diabetes (HCC)   . Hypothyroidism, acquired, autoimmune   . Thyroiditis, autoimmune     No past surgical history on file.  Social History   Socioeconomic History  . Marital status: Single    Spouse name: Not on file  . Number of children: Not on file  . Years of education: Not on file  . Highest education level: Not on file  Occupational History  . Not on file  Tobacco Use  . Smoking status: Never Smoker  . Smokeless tobacco: Never Used  Substance and Sexual Activity  . Alcohol use: No  . Drug use: No  . Sexual activity: Yes    Partners: Female  Other Topics Concern  . Not on file  Social History Narrative  . Not on file   Social Determinants of Health   Financial Resource Strain: Not on file  Food Insecurity: Not on file  Transportation  Needs: Not on file  Physical Activity: Not on file  Stress: Not on file  Social Connections: Not on file  Intimate Partner Violence: Not on file    Current Outpatient Medications on File Prior to Visit  Medication Sig Dispense Refill  . glucagon (GLUCAGON EMERGENCY) 1 MG injection Inject 1mg  intramuscular as needed for severe Hypoglecemia. Dispense 4= 30 days 4 each 4  . glucose blood (FREESTYLE LITE) test strip CHECK BG 10X DAY 200 strip 10  . Insulin Disposable Pump (OMNIPOD 10 PACK) MISC 1 Device by Does not apply route every 36 (thirty-six) hours. E10.65 135 each 3  . insulin lispro (HUMALOG) 100 UNIT/ML injection For use in pump, total of 120 units per day 120 mL 3  . levothyroxine (SYNTHROID) 200 MCG tablet TAKE 1 TABLET (200 MCG TOTAL) BY MOUTH DAILY BEFORE BREAKFAST. 90 tablet 3   No current facility-administered medications on file prior to visit.    No Known Allergies  Family History  Problem Relation Age of Onset  . Diabetes Mother   . Hypothyroidism Mother   . Cancer Mother   . Hypertension Mother   . Cancer Brother   . Hyperthyroidism Paternal Grandfather     BP 132/80 (BP Location: Right Arm, Patient Position: Sitting, Cuff Size: Large)   Pulse 77   Ht 6\' 2"  (1.88 m)  Wt 275 lb 9.6 oz (125 kg)   SpO2 97%   BMI 35.38 kg/m    Review of Systems     Objective:   Physical Exam VITAL SIGNS:  See vs page GENERAL: no distress Pulses: dorsalis pedis intact bilat.   MSK: no deformity of the feet CV: no leg edema Skin:  no ulcer on the feet.  normal color and temp on the feet. Neuro: sensation is intact to touch on the feet   Lab Results  Component Value Date   HGBA1C 9.0 (A) 03/28/2020       Assessment & Plan:  Type 1 DM: uncontrolled  Patient Instructions  check your blood sugar twice a day, to calibrate your monitor.  vary the time of day when you check, between before the 3 meals, and at bedtime.  also check if you have symptoms of your blood  sugar being too high or too low.  please keep a record of the readings and bring it to your next appointment here (or you can bring the meter itself).  You can write it on any piece of paper.  please call us sooner if your blood sugar goes below 70, or if you have a lot of readings over 200.  Please take the these settings:  basal rate of 5.5 units/hr, noon-10 PM, and 2.2 units/hr, overnight.   correction bolus (which some people call "sensitivity," or "insulin sensitivity ratio," or just "isr") of 1 unit for each 50 by which your glucose exceeds 100.   On this type of pump schedule, you should eat meals on a regular schedule (especially lunch).  If a meal is missed or significantly delayed, your blood sugar could go low.   Please come back for a follow-up appointment in 2 months.

## 2020-05-30 ENCOUNTER — Other Ambulatory Visit: Payer: Self-pay | Admitting: Endocrinology

## 2020-05-30 ENCOUNTER — Ambulatory Visit: Payer: Managed Care, Other (non HMO) | Admitting: Endocrinology

## 2020-05-30 ENCOUNTER — Other Ambulatory Visit: Payer: Self-pay

## 2020-05-30 VITALS — BP 126/80 | HR 69 | Ht 74.0 in | Wt 277.2 lb

## 2020-05-30 DIAGNOSIS — E10649 Type 1 diabetes mellitus with hypoglycemia without coma: Secondary | ICD-10-CM

## 2020-05-30 LAB — POCT GLYCOSYLATED HEMOGLOBIN (HGB A1C): Hemoglobin A1C: 9 % — AB (ref 4.0–5.6)

## 2020-05-30 MED ORDER — TRULICITY 0.75 MG/0.5ML ~~LOC~~ SOAJ: 0.7500 mg | SUBCUTANEOUS | 3 refills | Status: DC

## 2020-05-30 MED ORDER — LEVOTHYROXINE SODIUM 200 MCG PO TABS
200.0000 ug | ORAL_TABLET | Freq: Every day | ORAL | 3 refills | Status: DC
Start: 2020-05-30 — End: 2021-06-02

## 2020-05-30 NOTE — Progress Notes (Signed)
Subjective:    Patient ID: David [name], male    DOB: [birth year], 25 y.o.   MRN: [id number]  HPI Pt returns for f/u of diabetes mellitus: DM type: 1 Dx'ed: 2007 Complications: none Therapy: insulin since dx  DKA: never Severe hypoglycemia: never Pancreatitis: never. 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   Other: he uses an Omnipod pumps; he stopped using continuous glucose monitor; he says glucose at work is similar to off days.  Higher daytime basal does not start until noon, because breakfast is usually minimal.    Interval history:  He takes these settings:  basal rate of 5.5 units/hr, noon-10 PM, and 2.2 units/hr, overnight.   correction bolus (which some people call "sensitivity," or "insulin sensitivity ratio," or just "isr") of 1 unit for each 50 by which your glucose exceeds 100. TDD is 91 units (93% basal) Meter is downloaded today, and the printout is scanned into the record.  There are only 5 cbg's [phone]).  He still has mild hypoglycemia approx once per week.   Pt says he has mild hypoglycemia approx twice per week.  This usually happens fasting.   Past Medical History:  Diagnosis Date  . Diabetes mellitus   . Goiter   . Gynecomastia   . Hypoglycemia associated with diabetes (HCC)   . Hypothyroidism, acquired, autoimmune   . Thyroiditis, autoimmune     No past surgical history on file.  Social History   Socioeconomic History  . Marital status: Single    Spouse name: Not on file  . Number of children: Not on file  . Years of education: Not on file  . Highest education level: Not on file  Occupational History  . Not on file  Tobacco Use  . Smoking status: Never Smoker  . Smokeless tobacco: Never Used  Substance and Sexual Activity  . Alcohol use: No  . Drug use: No  . Sexual activity: Yes    Partners: Female  Other Topics Concern  . Not on file  Social History Narrative  . Not on file    Social Determinants of Health   Financial Resource Strain: Not on file  Food Insecurity: Not on file  Transportation Needs: Not on file  Physical Activity: Not on file  Stress: Not on file  Social Connections: Not on file  Intimate Partner Violence: Not on file    Current Outpatient Medications on File Prior to Visit  Medication Sig Dispense Refill  . glucagon (GLUCAGON EMERGENCY) 1 MG injection Inject 1mg  intramuscular as needed for severe Hypoglecemia. Dispense 4= 30 days 4 each 4  . glucose blood (FREESTYLE LITE) test strip CHECK BG 10X DAY 200 strip 10  . Insulin Disposable Pump (OMNIPOD 10 PACK) MISC 1 Device by Does not apply route every 36 (thirty-six) hours. E10.65 135 each 3  . insulin lispro (HUMALOG) 100 UNIT/ML injection For use in pump, total of 120 units per day 120 mL 3   No current facility-administered medications on file prior to visit.    No Known Allergies  Family History  Problem Relation Age of Onset  . Diabetes Mother   . Hypothyroidism Mother   . Cancer Mother   . Hypertension Mother   . Cancer Brother   . Hyperthyroidism Paternal Grandfather     BP 126/80 (BP Location: Right Arm, Patient Position: Sitting, Cuff Size: Large)   Pulse 69   Ht 6\' 2"  (  1.88 m)   Wt 277 lb 3.2 oz (125.7 kg)   SpO2 96%   BMI 35.59 kg/m   Review of Systems Denies LOC    Objective:  VITAL SIGNS:  See vs page GENERAL: no distress Pulses: dorsalis pedis intact bilat.   MSK: no deformity of the feet CV: no leg edema Skin:  no ulcer on the feet.  normal color and temp on the feet. Neuro: sensation is intact to touch on the feet.    Lab Results  Component Value Date   HGBA1C 9.0 (A) 05/30/2020   Lab Results  Component Value Date   TSH 0.57 08/10/2019   T4TOTAL 7.2 01/01/2016       Assessment & Plan:  Type 1 DM: uncontrolled Hypoglycemia, due to insulin: this limits aggressiveness of glycemic control  Patient Instructions  check your blood sugar twice  a day, to calibrate your monitor.  vary the time of day when you check, between before the 3 meals, and at bedtime.  also check if you have symptoms of your blood sugar being too high or too low.  please keep a record of the readings and bring it to your next appointment here (or you can bring the meter itself).  You can write it on any piece of paper.  please call us sooner if your blood sugar goes below 70, or if you have a lot of readings over 200.  I have sent a prescription to your pharmacy, to add Trulicity.  Please take the these settings:  basal rate of 5.0 units/hr, noon-10 PM, and 2.0 units/hr, overnight.   correction bolus (which some people call "sensitivity," or "insulin sensitivity ratio," or just "isr") of 1 unit for each 50 by which your glucose exceeds 100.   On this type of pump schedule, you should eat meals on a regular schedule (especially lunch).  If a meal is missed or significantly delayed, your blood sugar could go low.   Please come back for a follow-up appointment in 2 months.

## 2020-05-30 NOTE — Patient Instructions (Addendum)
check your blood sugar twice a day, to calibrate your monitor.  vary the time of day when you check, between before the 3 meals, and at bedtime.  also check if you have symptoms of your blood sugar being too high or too low.  please keep a record of the readings and bring it to your next appointment here (or you can bring the meter itself).  You can write it on any piece of paper.  please call us sooner if your blood sugar goes below 70, or if you have a lot of readings over 200.  I have sent a prescription to your pharmacy, to add Trulicity.  Please take the these settings:  basal rate of 5.0 units/hr, noon-10 PM, and 2.0 units/hr, overnight.   correction bolus (which some people call "sensitivity," or "insulin sensitivity ratio," or just "isr") of 1 unit for each 50 by which your glucose exceeds 100.   On this type of pump schedule, you should eat meals on a regular schedule (especially lunch).  If a meal is missed or significantly delayed, your blood sugar could go low.   Please come back for a follow-up appointment in 2 months.

## 2020-07-08 ENCOUNTER — Telehealth: Payer: Self-pay | Admitting: Endocrinology

## 2020-07-08 NOTE — Telephone Encounter (Signed)
Message sent thru MyChart 

## 2020-07-08 NOTE — Telephone Encounter (Signed)
Pt is calling because he has no more refills left on his trulicity and is wondering if that is a mistake since the Dr just put him on this new medication? On the reasoning for no refill it just states "refill not appropriate."    If possible, pt would like a refill sent to the pharmacy below.  [id number] - [place], [place] - [address] Phone:  [phone]  Fax:  [phone]

## 2020-07-22 ENCOUNTER — Telehealth: Payer: Self-pay

## 2020-07-22 NOTE — Telephone Encounter (Signed)
Internal fax sent to [facility] for pt regarding last OV notes to [phone]

## 2020-07-30 ENCOUNTER — Ambulatory Visit: Payer: Managed Care, Other (non HMO) | Admitting: Endocrinology

## 2020-07-30 ENCOUNTER — Other Ambulatory Visit: Payer: Self-pay | Admitting: Endocrinology

## 2020-07-30 ENCOUNTER — Other Ambulatory Visit: Payer: Self-pay

## 2020-07-30 VITALS — BP 118/70 | HR 65 | Ht 74.0 in | Wt 259.4 lb

## 2020-07-30 DIAGNOSIS — E10649 Type 1 diabetes mellitus with hypoglycemia without coma: Secondary | ICD-10-CM | POA: Diagnosis not present

## 2020-07-30 DIAGNOSIS — E063 Autoimmune thyroiditis: Secondary | ICD-10-CM | POA: Diagnosis not present

## 2020-07-30 LAB — BASIC METABOLIC PANEL
BUN: 7 mg/dL (ref 6–23)
CO2: 28 mEq/L (ref 19–32)
Calcium: 9.2 mg/dL (ref 8.4–10.5)
Chloride: 103 mEq/L (ref 96–112)
Creatinine, Ser: 0.78 mg/dL (ref 0.40–1.50)
GFR: 123.22 mL/min (ref >60.00)
Glucose, Bld: 199 mg/dL — ABNORMAL HIGH (ref 70–99)
Potassium: 4.3 mEq/L (ref 3.5–5.1)
Sodium: 137 mEq/L (ref 135–145)

## 2020-07-30 LAB — POCT GLYCOSYLATED HEMOGLOBIN (HGB A1C): Hemoglobin A1C: 7.7 % — AB (ref 4.0–5.6)

## 2020-07-30 LAB — T4, FREE: Free T4: 1.11 ng/dL (ref 0.60–1.60)

## 2020-07-30 LAB — TSH: TSH: 0.66 u[IU]/mL (ref 0.35–4.50)

## 2020-07-30 MED ORDER — TRULICITY 1.5 MG/0.5ML ~~LOC~~ SOAJ: 1.5000 mg | SUBCUTANEOUS | 3 refills | Status: DC

## 2020-07-30 NOTE — Patient Instructions (Addendum)
check your blood sugar twice a day, to calibrate your monitor.  vary the time of day when you check, between before the 3 meals, and at bedtime.  also check if you have symptoms of your blood sugar being too high or too low.  please keep a record of the readings and bring it to your next appointment here (or you can bring the meter itself).  You can write it on any piece of paper.  please call us sooner if your blood sugar goes below 70, or if you have a lot of readings over 200.  I have sent a prescription to your pharmacy, to increase the Trulicity.  Please take the these settings:  basal rate of 4.0 units/hr, noon-10 PM, and 1.5 units/hr, overnight.   correction bolus (which some people call "sensitivity," or "insulin sensitivity ratio," or just "isr") of 1 unit for each 50 by which your glucose exceeds 100.   On this type of pump schedule, you should eat meals on a regular schedule (especially lunch).  If a meal is missed or significantly delayed, your blood sugar could go low. Please call if the heartburn gets worse.   Please come back for a follow-up appointment in 2 months.

## 2020-07-30 NOTE — Progress Notes (Signed)
Subjective:    Patient ID: [name], male    DOB: 03-31-1995, 25 y.o.   MRN: [id number]  HPI Pt returns for f/u of diabetes mellitus: DM type: 1 Dx'ed: 2007 Complications: none Therapy: insulin since dx  DKA: never Severe hypoglycemia: never Pancreatitis: never. 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   Other: he uses an Omnipod pumps; he stopped using continuous glucose monitor; he says glucose at work is similar to off days.  Higher daytime basal does not start until noon, because breakfast is usually minimal.   Interval history:  He takes these settings:  basal rate of 5.0 units/hr, noon-10 PM, and 2.1 units/hr, overnight.   correction bolus (which some people call "sensitivity," or "insulin sensitivity ratio," or just "isr") of 1 unit for each 50 by which your glucose exceeds 100.    TDD is 68 units (99% basal) Meter is downloaded today, and the printout is scanned into the record.  There are only 8 cbg's (29-434).  There is no trend throughout the day.  Pt says he has mild hypoglycemia approx twice per week.  This usually happens fasting.   Pt says Trulicity is greatly helping glycemic control.   TSH was high at work.   Past Medical History:  Diagnosis Date  . Diabetes mellitus   . Goiter   . Gynecomastia   . Hypoglycemia associated with diabetes (HCC)   . Hypothyroidism, acquired, autoimmune   . Thyroiditis, autoimmune     No past surgical history on file.  Social History   Socioeconomic History  . Marital status: Single    Spouse name: Not on file  . Number of children: Not on file  . Years of education: Not on file  . Highest education level: Not on file  Occupational History  . Not on file  Tobacco Use  . Smoking status: Never Smoker  . Smokeless tobacco: Never Used  Substance and Sexual Activity  . Alcohol use: No  . Drug use: No  . Sexual activity: Yes    Partners: Female  Other Topics  Concern  . Not on file  Social History Narrative  . Not on file   Social Determinants of Health   Financial Resource Strain: Not on file  Food Insecurity: Not on file  Transportation Needs: Not on file  Physical Activity: Not on file  Stress: Not on file  Social Connections: Not on file  Intimate Partner Violence: Not on file    Current Outpatient Medications on File Prior to Visit  Medication Sig Dispense Refill  . glucagon (GLUCAGON EMERGENCY) 1 MG injection Inject 1mg  intramuscular as needed for severe Hypoglecemia. Dispense 4= 30 days 4 each 4  . glucose blood (FREESTYLE LITE) test strip CHECK BG 10X DAY 200 strip 10  . Insulin Disposable Pump (OMNIPOD 10 PACK) MISC 1 Device by Does not apply route every 36 (thirty-six) hours. E10.65 135 each 3  . insulin lispro (HUMALOG) 100 UNIT/ML injection For use in pump, total of 120 units per day 120 mL 3  . levothyroxine (SYNTHROID) 200 MCG tablet Take 1 tablet (200 mcg total) by mouth daily before breakfast. 90 tablet 3   No current facility-administered medications on file prior to visit.    No Known Allergies  Family History  Problem Relation Age of Onset  . Diabetes Mother   . Hypothyroidism Mother   . Cancer Mother   . Hypertension  Mother   . Cancer Brother   . Hyperthyroidism Paternal Grandfather     BP 118/70 (BP Location: Right Arm, Patient Position: Sitting, Cuff Size: Large)   Pulse 65   Ht 6\' 2"  (1.88 m)   Wt 259 lb 6.4 oz (117.7 kg)   SpO2 96%   BMI 33.30 kg/m   Review of Systems He has lost weight.  He has mild heartburn.      Objective:   Physical Exam VITAL SIGNS:  See vs page GENERAL: no distress Pulses: dorsalis pedis intact bilat.   MSK: no deformity of the feet CV: no leg edema Skin:  no ulcer on the feet.  normal color and temp on the feet. Neuro: sensation is intact to touch on the feet.    A1c=7.7%  Lab Results  Component Value Date   TSH 0.66 07/30/2020   T4TOTAL 7.2 01/01/2016       Assessment & Plan:  Type 1 DM: uncontrolled Hypothyroidism: well-controlled.  Please continue the same synthroid.  Heartburn, due to Trulicity.  As it is helping though, we'll try to increase.   Patient Instructions  check your blood sugar twice a day, to calibrate your monitor.  vary the time of day when you check, between before the 3 meals, and at bedtime.  also check if you have symptoms of your blood sugar being too high or too low.  please keep a record of the readings and bring it to your next appointment here (or you can bring the meter itself).  You can write it on any piece of paper.  please call 01/03/2016 sooner if your blood sugar goes below 70, or if you have a lot of readings over 200.  I have sent a prescription to your pharmacy, to increase the Trulicity.  Please take the these settings:  basal rate of 4.0 units/hr, noon-10 PM, and 1.5 units/hr, overnight.   correction bolus (which some people call "sensitivity," or "insulin sensitivity ratio," or just "isr") of 1 unit for each 50 by which your glucose exceeds 100.   On this type of pump schedule, you should eat meals on a regular schedule (especially lunch).  If a meal is missed or significantly delayed, your blood sugar could go low. Please call if the heartburn gets worse.   Please come back for a follow-up appointment in 2 months.

## 2020-08-01 ENCOUNTER — Telehealth: Payer: Self-pay | Admitting: Endocrinology

## 2020-08-01 NOTE — Telephone Encounter (Signed)
please contact patient: We are getting multiple conflicting requests.  Please tell us which ins will pay for.

## 2020-08-01 NOTE — Telephone Encounter (Signed)
Pt states that he was seen by provider yesterday. The  Dulaglutide (TRULICITY) 1.5 MG/0.5ML SOPN since dose was increased insurance will not pay for it. Insurance is needing a PA.

## 2020-08-05 NOTE — Telephone Encounter (Signed)
Alternative is Ozempic.  Is this covered?

## 2020-08-06 NOTE — Telephone Encounter (Signed)
Patient called to check on PA for Trulicity - advised PT to also call his insurance regarding Ozempic.  Call back number [phone]

## 2020-08-07 NOTE — Telephone Encounter (Signed)
Message sent thru MyChart 

## 2020-08-07 NOTE — Telephone Encounter (Signed)
please contact patient: We are getting multiple conflicting requests.  Please tell us which ins will pay for. 

## 2020-10-10 ENCOUNTER — Telehealth: Payer: Self-pay

## 2020-10-10 NOTE — Telephone Encounter (Signed)
Last OV notes faxed to [facility] [phone]

## 2020-10-29 ENCOUNTER — Other Ambulatory Visit: Payer: Self-pay | Admitting: Endocrinology

## 2020-10-30 ENCOUNTER — Encounter: Payer: Self-pay | Admitting: Endocrinology

## 2020-10-30 ENCOUNTER — Other Ambulatory Visit: Payer: Self-pay

## 2020-10-30 ENCOUNTER — Ambulatory Visit: Payer: Managed Care, Other (non HMO) | Admitting: Endocrinology

## 2020-10-30 VITALS — BP 142/90 | HR 66 | Ht 74.0 in | Wt 246.0 lb

## 2020-10-30 DIAGNOSIS — E10649 Type 1 diabetes mellitus with hypoglycemia without coma: Secondary | ICD-10-CM

## 2020-10-30 LAB — POCT GLYCOSYLATED HEMOGLOBIN (HGB A1C): Hemoglobin A1C: 8.8 % — AB (ref 4.0–5.6)

## 2020-10-30 MED ORDER — OMNIPOD 5 DEXG7G6 INTRO GEN 5 KIT: 1.0000 | PACK | 3 refills | Status: DC

## 2020-10-30 MED ORDER — TRULICITY 1.5 MG/0.5ML ~~LOC~~ SOAJ: 1.5000 mg | SUBCUTANEOUS | 3 refills | Status: DC

## 2020-10-30 NOTE — Progress Notes (Signed)
Subjective:    Patient ID: [name], male    DOB: [birth year], 25 y.o.   MRN: [id number]  HPI Pt returns for f/u of diabetes mellitus: DM type: 1 Dx'ed: 2007 Complications: none Therapy: insulin since dx  DKA: never Severe hypoglycemia: never Pancreatitis: never. 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  he says glucose at work is similar to off days. Other: he uses an Omnipod pumps; he stopped using continuous glucose monitor.  Higher daytime basal does not start until noon, because breakfast is usually minimal.   Interval history:  He takes these settings:  basal rate of 4.0 units/hr, noon-10 PM, and 1.5 units/hr, overnight.  No mealtime boluses, as he cannot remember to take  correction bolus (which some people call "sensitivity," or "insulin sensitivity ratio," or just "isr") of 1 unit for each 50 by which your glucose exceeds 100.  TDD is 63 units (89% basal).  He takes 0.9 avg boluses per day Meter is downloaded today, and the printout is scanned into the record.  There are only 2 cbg's (300 and 400).  There is no trend throughout the day.  Pt says he has mild hypoglycemia approx twice per week.  This usually happens fasting.   Pt says he has not increased the Trulicity.  Pt also has h/o hypothyroidism.  Past Medical History:  Diagnosis Date   Diabetes mellitus    Goiter    Gynecomastia    Hypoglycemia associated with diabetes (HCC)    Hypothyroidism, acquired, autoimmune    Thyroiditis, autoimmune     No past surgical history on file.  Social History   Socioeconomic History   Marital status: Single    Spouse name: Not on file   Number of children: Not on file   Years of education: Not on file   Highest education level: Not on file  Occupational History   Not on file  Tobacco Use   Smoking status: Never   Smokeless tobacco: Never  Substance and Sexual Activity   Alcohol use: No   Drug use: No    Sexual activity: Yes    Partners: Female  Other Topics Concern   Not on file  Social History Narrative   Not on file   Social Determinants of Health   Financial Resource Strain: Not on file  Food Insecurity: Not on file  Transportation Needs: Not on file  Physical Activity: Not on file  Stress: Not on file  Social Connections: Not on file  Intimate Partner Violence: Not on file    Current Outpatient Medications on File Prior to Visit  Medication Sig Dispense Refill   glucagon (GLUCAGON EMERGENCY) 1 MG injection Inject 1mg  intramuscular as needed for severe Hypoglecemia. Dispense 4= 30 days 4 each 4   glucose blood (FREESTYLE LITE) test strip CHECK BG 10X DAY 200 strip 10   insulin lispro (HUMALOG) 100 UNIT/ML injection For use in pump, total of 120 units per day 120 mL 3   levothyroxine (SYNTHROID) 200 MCG tablet Take 1 tablet (200 mcg total) by mouth daily before breakfast. 90 tablet 3   No current facility-administered medications on file prior to visit.    No Known Allergies  Family History  Problem Relation Age of Onset   Diabetes Mother    Hypothyroidism Mother    Cancer Mother    Hypertension Mother    Cancer Brother    Hyperthyroidism Paternal  BP (!) 142/90   Pulse 66   Ht 6\' 2"  (1.88 m)   Wt 246 lb (111.6 kg)   SpO2 99%   BMI 31.58 kg/m    Review of Systems     Objective:   Physical Exam Pulses: dorsalis pedis intact bilat.   MSK: no deformity of the feet CV: no leg edema Skin:  no ulcer on the feet.  normal color and temp on the feet. Neuro: sensation is intact to touch on the feet  Lab Results  Component Value Date   TSH 0.66 07/30/2020   T4TOTAL 7.2 01/01/2016    A1c=8.8%    Assessment & Plan:  Type 1 DM: uncontrolled  Patient Instructions  check your blood sugar twice a day, to calibrate your monitor.  vary the time of day when you check, between before the 3 meals, and at bedtime.  also check if you have symptoms of  your blood sugar being too high or too low.  please keep a record of the readings and bring it to your next appointment here (or you can bring the meter itself).  You can write it on any piece of paper.  please call 01/03/2016 sooner if your blood sugar goes below 70, or if you have a lot of readings over 200.  I have sent a prescription to your pharmacy, to change the Trulicity to Ozempic Please take the these settings:  basal rate of 4.0 units/hr, noon-10 PM, and 1.5 units/hr, overnight.   correction bolus (which some people call "sensitivity," or "insulin sensitivity ratio," or just "isr") of 1 unit for each 50 by which your glucose exceeds 100.   On this type of pump schedule, you should eat meals on a regular schedule (especially lunch).  If a meal is missed or significantly delayed, your blood sugar could go low.  Please come back for a follow-up appointment in 2 months.

## 2020-10-30 NOTE — Patient Instructions (Addendum)
check your blood sugar twice a day, to calibrate your monitor.  vary the time of day when you check, between before the 3 meals, and at bedtime.  also check if you have symptoms of your blood sugar being too high or too low.  please keep a record of the readings and bring it to your next appointment here (or you can bring the meter itself).  You can write it on any piece of paper.  please call us sooner if your blood sugar goes below 70, or if you have a lot of readings over 200.  I have sent a prescription to your pharmacy, to change the Trulicity to Ozempic Please take the these settings:  basal rate of 4.0 units/hr, noon-10 PM, and 1.5 units/hr, overnight.   correction bolus (which some people call "sensitivity," or "insulin sensitivity ratio," or just "isr") of 1 unit for each 50 by which your glucose exceeds 100.   On this type of pump schedule, you should eat meals on a regular schedule (especially lunch).  If a meal is missed or significantly delayed, your blood sugar could go low.  Please come back for a follow-up appointment in 2 months.

## 2020-10-31 ENCOUNTER — Other Ambulatory Visit: Payer: Self-pay | Admitting: Endocrinology

## 2020-10-31 MED ORDER — SEMAGLUTIDE (1 MG/DOSE) 4 MG/3ML ~~LOC~~ SOPN: 1.0000 mg | PEN_INJECTOR | SUBCUTANEOUS | 3 refills | Status: DC

## 2020-11-04 ENCOUNTER — Other Ambulatory Visit (HOSPITAL_COMMUNITY): Payer: Self-pay

## 2020-11-04 ENCOUNTER — Telehealth: Payer: Self-pay | Admitting: Pharmacy Technician

## 2020-11-04 NOTE — Telephone Encounter (Signed)
Patient Advocate Encounter  Left a HIPAA compliant VM  Received PA request for Trulicity, but see that Dr. Everardo All d/c it and ordered Ozempic.   Would like to know from the pt if he would like to stay on Trulicity and [place] get the PA for the higher dose, 1.5mg , or if he was switching to Ozempic because it wasn't helping. Test claim for Ozempic denies due to Trulicity being filled 08/06/20. Spoke with the pharmacy who says the trulicity still says PA required and Ozempic is on a back order. Will request PA for Trulicity unless I hear different from the pt.   Sherilyn Dacosta, CPhT Patient Advocate [facility] Phone: [phone] Fax:  [phone]

## 2020-11-05 NOTE — Telephone Encounter (Signed)
Patient Advocate Encounter   Received notification from [facility] that prior authorization for Trulicity 1.5mg  is required.   PA submitted on 11/05/20 Key [id number] Status is pending    [facility] will continue to follow:   Sherilyn Dacosta, CPhT Patient Advocate  [facility] Phone: [phone] Fax:  [phone]

## 2020-11-12 ENCOUNTER — Other Ambulatory Visit (HOSPITAL_COMMUNITY): Payer: Self-pay

## 2020-11-12 NOTE — Telephone Encounter (Signed)
Patient Advocate Encounter   Received notification from [employer] that prior authorization for [facility] is required.   PA submitted on 11/12/2020 Key [id number] Status is pending    [facility] will continue to follow   Jeannette How, CPhT Patient Advocate [facility] Phone: [phone] Fax:  [phone]

## 2020-11-14 ENCOUNTER — Other Ambulatory Visit (HOSPITAL_COMMUNITY): Payer: Self-pay

## 2020-11-14 NOTE — Telephone Encounter (Signed)
Left HIPAA compliant Voice Mail for pt to call back.  Tulicity and Ozempic PAs have denied. I'm trying to figure out if his insurance changed since he was on the Trulicity before, or if it is the same. If it's the same I can call to try to get an appeal.   Sherilyn Dacosta, CPhT Patient Advocate

## 2020-11-18 ENCOUNTER — Other Ambulatory Visit (HOSPITAL_COMMUNITY): Payer: Self-pay

## 2020-11-18 NOTE — Telephone Encounter (Signed)
[facility] Patient Advocate Encounter  Prior Authorization for Trulicity 1.5 has been approved.    Effective through 06/01/2021 (The original PA date) If strength changes again, we just need to call [employer] to change it if it's before this date.  Patients co-pay is $0.   Sherilyn Dacosta, CPhT Patient Advocate [facility] Phone: [phone] Fax:  [phone]

## 2020-11-27 ENCOUNTER — Encounter: Payer: Self-pay | Admitting: Endocrinology

## 2020-12-30 ENCOUNTER — Ambulatory Visit (INDEPENDENT_AMBULATORY_CARE_PROVIDER_SITE_OTHER): Payer: Managed Care, Other (non HMO) | Admitting: Endocrinology

## 2020-12-30 ENCOUNTER — Encounter: Payer: Self-pay | Admitting: Endocrinology

## 2020-12-30 ENCOUNTER — Other Ambulatory Visit: Payer: Self-pay

## 2020-12-30 VITALS — BP 124/78 | HR 76 | Ht 74.0 in | Wt 245.6 lb

## 2020-12-30 DIAGNOSIS — E10649 Type 1 diabetes mellitus with hypoglycemia without coma: Secondary | ICD-10-CM

## 2020-12-30 LAB — POCT GLYCOSYLATED HEMOGLOBIN (HGB A1C): Hemoglobin A1C: 8.7 % — AB (ref 4.0–5.6)

## 2020-12-30 MED ORDER — TRULICITY 3 MG/0.5ML ~~LOC~~ SOAJ: 3.0000 mg | SUBCUTANEOUS | 3 refills | Status: DC

## 2020-12-30 NOTE — Patient Instructions (Addendum)
check your blood sugar twice a day, to calibrate your monitor.  vary the time of day when you check, between before the 3 meals, and at bedtime.  also check if you have symptoms of your blood sugar being too high or too low.  please keep a record of the readings and bring it to your next appointment here (or you can bring the meter itself).  You can write it on any piece of paper.  please call us sooner if your blood sugar goes below 70, or if you have a lot of readings over 200.  I have sent a prescription to your pharmacy, to increase the Trulicity again.   Please take the these settings:  basal rate of 1.5 units/HR 12MN-12N, 3.8 units/hr, noon-10 PM, and 1.4 units/hr, 10PM-12MN correction bolus (which some people call "sensitivity," or "insulin sensitivity ratio," or just "isr") of 1 unit for each 50 by which your glucose exceeds 100.  On this type of pump schedule, you should eat meals on a regular schedule (especially lunch).  If a meal is missed or significantly delayed, your blood sugar could go low.   We are placing a continuous glucose monitor today.   Please come back for a follow-up appointment in 2 weeks.  Please see Bonita Quin the same day.

## 2020-12-30 NOTE — Progress Notes (Signed)
 Subjective:    Patient ID: [name], male    DOB: 11/17/1995, 25 y.o.   MRN: [id number]  HPI Pt returns for f/u of diabetes mellitus: DM type: 1 Dx'ed: 2007 Complications: none Therapy: insulin since dx  DKA: never Severe hypoglycemia: never.   Pancreatitis: never. 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  he says glucose at work is similar to off days, but he seldom checks cbg.   Other: he uses an Omnipod pumps; he stopped using continuous glucose monitor.  Higher daytime basal does not start until noon, because breakfast is usually minimal.   Interval history:  He takes these settings:  basal rate of 1.5 units/HR 12MN-12N, 3.8 units/hr, noon-10 PM, and 1.4 units/hr, 10PM-12MN No mealtime boluses, as he cannot remember to take  correction bolus (which some people call "sensitivity," or "insulin sensitivity ratio," or just "isr") of 1 unit for each 50 by which your glucose exceeds 100.  He reduced overnight basal, due to hypoglycemia.   TDD is 53 units (96% basal).  He takes 0.4 avg boluses per day.   Meter is downloaded today, and the printout is scanned into the record.  There are only 3 cbg's (50 and 65).  There is no trend throughout the day.  Pt says he has mild hypoglycemia approx twice per week.  This usually happens fasting.   Pt says he has not increased the Trulicity.  Pt also has h/o hypothyroidism.  Past Medical History:  Diagnosis Date   Diabetes mellitus    Goiter    Gynecomastia    Hypoglycemia associated with diabetes (HCC)    Hypothyroidism, acquired, autoimmune    Thyroiditis, autoimmune     No past surgical history on file.  Social History   Socioeconomic History   Marital status: Single    Spouse name: Not on file   Number of children: Not on file   Years of education: Not on file   Highest education level: Not on file  Occupational History   Not on file  Tobacco Use   Smoking status:  Never   Smokeless tobacco: Never  Substance and Sexual Activity   Alcohol use: No   Drug use: No   Sexual activity: Yes    Partners: Female  Other Topics Concern   Not on file  Social History Narrative   Not on file   Social Determinants of Health   Financial Resource Strain: Not on file  Food Insecurity: Not on file  Transportation Needs: Not on file  Physical Activity: Not on file  Stress: Not on file  Social Connections: Not on file  Intimate Partner Violence: Not on file    Current Outpatient Medications on File Prior to Visit  Medication Sig Dispense Refill   glucagon (GLUCAGON EMERGENCY) 1 MG injection Inject 1mg intramuscular as needed for severe Hypoglecemia. Dispense 4= 30 days 4 each 4   glucose blood (FREESTYLE LITE) test strip CHECK BG 10X DAY 200 strip 10   Insulin Disposable Pump (OMNIPOD 5 G6 INTRO, GEN 5,) KIT 1 Device by Does not apply route every 36 (thirty-six) hours. 60 kit 3   insulin lispro (HUMALOG) 100 UNIT/ML injection For use in pump, total of 120 units per day 120 mL 3   levothyroxine (SYNTHROID) 200 MCG tablet Take 1 tablet (200 mcg total) by mouth daily before breakfast. 90 tablet 3   No current facility-administered medications on file   prior to visit.    No Known Allergies  Family History  Problem Relation Age of Onset   Diabetes Mother    Hypothyroidism Mother    Cancer Mother    Hypertension Mother    Cancer Brother    Hyperthyroidism Paternal Grandfather     BP 124/78 (BP Location: Right Arm, Patient Position: Sitting, Cuff Size: Large)   Pulse 76   Ht 6' 2" (1.88 m)   Wt 245 lb 9.6 oz (111.4 kg)   SpO2 97%   BMI 31.53 kg/m    Review of Systems Pt says he had to go to ER with nausea, but it is since resolved.      Objective:   Physical Exam Pulses: dorsalis pedis intact bilat.   MSK: no deformity of the feet CV: no leg edema Skin:  no ulcer on the feet.  normal color and temp on the feet.  Neuro: sensation is intact to  touch on the feet.     A1c=8.7%    Assessment & Plan:  Type 1 DM, uncertain dx: uncontrolled Nausea: we discussed.  Pt wants to increase trulicity, as sxs are better.   Patient Instructions  check your blood sugar twice a day, to calibrate your monitor.  vary the time of day when you check, between before the 3 meals, and at bedtime.  also check if you have symptoms of your blood sugar being too high or too low.  please keep a record of the readings and bring it to your next appointment here (or you can bring the meter itself).  You can write it on any piece of paper.  please call us sooner if your blood sugar goes below 70, or if you have a lot of readings over 200.  I have sent a prescription to your pharmacy, to increase the Trulicity again.   Please take the these settings:  basal rate of 1.5 units/HR 12MN-12N, 3.8 units/hr, noon-10 PM, and 1.4 units/hr, 10PM-12MN correction bolus (which some people call "sensitivity," or "insulin sensitivity ratio," or just "isr") of 1 unit for each 50 by which your glucose exceeds 100.  On this type of pump schedule, you should eat meals on a regular schedule (especially lunch).  If a meal is missed or significantly delayed, your blood sugar could go low.   We are placing a continuous glucose monitor today.   Please come back for a follow-up appointment in 2 weeks.  Please see Linda the same day.   

## 2020-12-31 ENCOUNTER — Other Ambulatory Visit (HOSPITAL_COMMUNITY): Payer: Self-pay

## 2021-01-02 ENCOUNTER — Telehealth: Payer: Self-pay | Admitting: Pharmacy Technician

## 2021-01-02 ENCOUNTER — Other Ambulatory Visit (HOSPITAL_COMMUNITY): Payer: Self-pay

## 2021-01-02 NOTE — Telephone Encounter (Signed)
Patient Advocate Encounter   Received notification from CoverMyMeds that prior authorization for Trulicity 3mg  is required.  Called per previous encounter. [phone] [employer].  Prior Authorization has been updated from 1.5mg  to 3mg .    Effective dates: through 06/01/21  Patients co-pay is $0 per test claim.   , CPhT Patient Advocate [facility] Phone: [phone] Fax:  [phone]

## 2021-01-10 ENCOUNTER — Other Ambulatory Visit: Payer: Self-pay | Admitting: Endocrinology

## 2021-01-10 DIAGNOSIS — E10649 Type 1 diabetes mellitus with hypoglycemia without coma: Secondary | ICD-10-CM

## 2021-01-14 ENCOUNTER — Encounter: Payer: Managed Care, Other (non HMO) | Attending: Endocrinology | Admitting: Nutrition

## 2021-01-14 ENCOUNTER — Other Ambulatory Visit: Payer: Self-pay

## 2021-01-14 ENCOUNTER — Other Ambulatory Visit: Payer: Self-pay | Admitting: Endocrinology

## 2021-01-14 DIAGNOSIS — E1142 Type 2 diabetes mellitus with diabetic polyneuropathy: Secondary | ICD-10-CM | POA: Insufficient documentation

## 2021-01-14 MED ORDER — DEXCOM G6 SENSOR MISC: 1.0000 | 3 refills | Status: DC

## 2021-01-14 MED ORDER — DEXCOM G6 TRANSMITTER MISC: 1.0000 | Freq: Once | 1 refills | Status: AC

## 2021-01-14 MED ORDER — OMNIPOD 5 DEXG7G6 PODS GEN 5 MISC: 1.0000 | 3 refills | Status: DC

## 2021-01-14 MED ORDER — DEXCOM G6 RECEIVER DEVI: 1.0000 | Freq: Once | 1 refills | Status: AC

## 2021-01-14 NOTE — Progress Notes (Signed)
Patient reports that he is having great difficulty with the distributor sending his pods to his OmniPod pump.  They keep shorting him his supplies.  He has called, emailed them, and texted them with no reply. Discussed the new OmniPod 5 and that this is a prescription that can be picked up the the pharmacy.  He is very interested in this.  Note to Dr. Everardo All to send a script to his pharmacy, along with the Dexcom G6 sensor. He was shown how to use the Dexcom and one was started on him today, and linked to [facility].  He reported good understanding of how to use this and will let me know if he wants to go with the new OmniPOd.

## 2021-01-14 NOTE — Patient Instructions (Signed)
Change sensor every 10 days Change transmitter every 3 months Call to let me know if you want help to start this new pump, or if you want to watch the videos and do this yourself.

## 2021-01-15 ENCOUNTER — Telehealth: Payer: Self-pay | Admitting: Nutrition

## 2021-01-15 ENCOUNTER — Ambulatory Visit: Payer: Managed Care, Other (non HMO) | Admitting: Endocrinology

## 2021-01-15 ENCOUNTER — Encounter: Payer: Self-pay | Admitting: Endocrinology

## 2021-01-15 ENCOUNTER — Telehealth (INDEPENDENT_AMBULATORY_CARE_PROVIDER_SITE_OTHER): Payer: Managed Care, Other (non HMO) | Admitting: Endocrinology

## 2021-01-15 DIAGNOSIS — E109 Type 1 diabetes mellitus without complications: Secondary | ICD-10-CM

## 2021-01-15 NOTE — Telephone Encounter (Signed)
Message sent thru MyChart 

## 2021-01-15 NOTE — Telephone Encounter (Signed)
I reviewed continuous glucose monitor data.  Glucose varies from 50-400.  It is in general highest at 3AM-2PM, and lowest 6-7PM.  There is a steep drop 2PM-6PM.   please contact patient: Please take these settings: Please take the these settings:  basal rate of 2.8 units/hr, noon-7 PM, and 1.8 units/hr, 7PM-noon correction bolus (which some people call "sensitivity," or "insulin sensitivity ratio," or just "isr") of 1 unit for each 50 by which your glucose exceeds 100.

## 2021-01-15 NOTE — Telephone Encounter (Signed)
Patient is requesting that the prescriptions for the OmniPod 5 and Dexcoms that DR. Everardo All sent to [facility] pharmacy be [facility] [address] [name]

## 2021-01-16 ENCOUNTER — Other Ambulatory Visit: Payer: Self-pay

## 2021-01-16 DIAGNOSIS — E10649 Type 1 diabetes mellitus with hypoglycemia without coma: Secondary | ICD-10-CM

## 2021-01-16 MED ORDER — OMNIPOD 5 DEXG7G6 INTRO GEN 5 KIT: 1.0000 | PACK | 3 refills | Status: DC

## 2021-01-16 MED ORDER — DEXCOM G6 SENSOR MISC: 1.0000 | 3 refills | Status: DC

## 2021-01-16 MED ORDER — OMNIPOD 5 DEXG7G6 PODS GEN 5 MISC: 1.0000 | 3 refills | Status: DC

## 2021-01-16 NOTE — Telephone Encounter (Signed)
Rx sent 

## 2021-01-17 ENCOUNTER — Encounter: Payer: Self-pay | Admitting: Endocrinology

## 2021-01-17 ENCOUNTER — Ambulatory Visit: Payer: Managed Care, Other (non HMO) | Admitting: Endocrinology

## 2021-01-17 ENCOUNTER — Other Ambulatory Visit: Payer: Self-pay

## 2021-01-17 DIAGNOSIS — E1065 Type 1 diabetes mellitus with hyperglycemia: Secondary | ICD-10-CM

## 2021-01-17 DIAGNOSIS — E109 Type 1 diabetes mellitus without complications: Secondary | ICD-10-CM | POA: Diagnosis not present

## 2021-01-17 NOTE — Patient Instructions (Addendum)
check your blood sugar twice a day, to calibrate your monitor.  vary the time of day when you check, between before the 3 meals, and at bedtime.  also check if you have symptoms of your blood sugar being too high or too low.  please keep a record of the readings and bring it to your next appointment here (or you can bring the meter itself).  You can write it on any piece of paper.  please call us sooner if your blood sugar goes below 70, or if you have a lot of readings over 200.  I have sent a prescription to your pharmacy, to increase the Trulicity again.   Please take the these settings:  basal rate of 2.8 units/hr, noon-7 PM, and 1.8 units/hr, 7PM-noon.   correction bolus (which some people call "sensitivity," or "insulin sensitivity ratio," or just "isr") of 1 unit for each 50 by which your glucose exceeds 100.   On this type of pump schedule, you should eat meals on a regular schedule (especially lunch).  If a meal is missed or significantly delayed, your blood sugar could go low.  Please come back for a follow-up appointment in 2 months.

## 2021-01-17 NOTE — Progress Notes (Signed)
Subjective:    Patient ID: David [name], male    DOB: 22-Jun-1995, 25 y.o.   MRN: [id number]  HPI Pt returns for f/u of diabetes mellitus:   DM type: 1 Dx'ed: 5170 Complications: none Therapy: insulin since dx  DKA: never Severe hypoglycemia: never.   Pancreatitis: never. 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  he says glucose at work is similar to off days, but he seldom checks cbg.   Other: he uses an Omnipod pumps; he stopped using continuous glucose monitor.  Higher daytime basal does not start until noon, because breakfast is usually minimal.   Interval history:  He takes these pump settings: basal rate of 1.5 units/HR 12MN-12N, 3.8 units/hr, noon-10 PM, and 1.4 units/hr, 10PM-12MN.   correction bolus (which some people call "sensitivity," or "insulin sensitivity ratio," or just "isr") of 1 unit for each 50 by which your glucose exceeds 100.   he did not access the message from 2 days ago.   Past Medical History:  Diagnosis Date   Diabetes mellitus    Goiter    Gynecomastia    Hypoglycemia associated with diabetes ([place])    Hypothyroidism, acquired, autoimmune    Thyroiditis, autoimmune     No past surgical history on file.  Social History   Socioeconomic History   Marital status: Single    Spouse name: Not on file   Number of children: Not on file   Years of education: Not on file   Highest education level: Not on file  Occupational History   Not on file  Tobacco Use   Smoking status: Never   Smokeless tobacco: Never  Substance and Sexual Activity   Alcohol use: No   Drug use: No   Sexual activity: Yes    Partners: Female  Other Topics Concern   Not on file  Social History Narrative   Not on file   Social Determinants of Health   Financial Resource Strain: Not on file  Food Insecurity: Not on file  Transportation Needs: Not on file  Physical Activity: Not on file  Stress: Not on file   Social Connections: Not on file  Intimate Partner Violence: Not on file    Current Outpatient Medications on File Prior to Visit  Medication Sig Dispense Refill   Continuous Blood Gluc Sensor (DEXCOM G6 SENSOR) MISC 1 Device by Does not apply route See admin instructions. Change every 10 days 9 each 3   Dulaglutide (TRULICITY) 3 YF/7.4BS SOPN Inject 3 mg as directed once a week. 6 mL 3   glucagon (GLUCAGON EMERGENCY) 1 MG injection Inject $RemoveBeforeDE'1mg'[id number]$  intramuscular as needed for severe Hypoglecemia. Dispense 4= 30 days 4 each 4   glucose blood (FREESTYLE LITE) test strip CHECK BG 10X DAY 200 strip 10   Insulin Disposable Pump (OMNIPOD 5 G6 INTRO, GEN 5,) KIT 1 Device by Does not apply route every 36 (thirty-six) hours. 60 kit 3   Insulin Disposable Pump (OMNIPOD 5 G6 POD, GEN 5,) MISC 1 Device by Does not apply route every 3 (three) days. 30 each 3   insulin lispro (HUMALOG) 100 UNIT/ML injection FOR USE IN PUMP, TOTAL OF 120 UNITS PER DAY 120 mL 3   levothyroxine (SYNTHROID) 200 MCG tablet Take 1 tablet (200 mcg total) by mouth daily before breakfast. 90 tablet 3   No current facility-administered medications on file prior to visit.    No Known Allergies  Family History  Problem Relation Age of Onset   Diabetes Mother    Hypothyroidism Mother    Cancer Mother    Hypertension Mother    Cancer Brother    Hyperthyroidism Paternal Grandfather     There were no vitals taken for this visit.  Review of Systems He has slight nausea.      Objective:   Physical Exam      Assessment & Plan:  Type 1 DM: uncontrolled  Patient Instructions  check your blood sugar twice a day, to calibrate your monitor.  vary the time of day when you check, between before the 3 meals, and at bedtime.  also check if you have symptoms of your blood sugar being too high or too low.  please keep a record of the readings and bring it to your next appointment here (or you can bring the meter itself).  You can write  it on any piece of paper.  please call us sooner if your blood sugar goes below 70, or if you have a lot of readings over 200.  I have sent a prescription to your pharmacy, to increase the Trulicity again.   Please take the these settings:  basal rate of 2.8 units/hr, noon-7 PM, and 1.8 units/hr, 7PM-noon.   correction bolus (which some people call "sensitivity," or "insulin sensitivity ratio," or just "isr") of 1 unit for each 50 by which your glucose exceeds 100.   On this type of pump schedule, you should eat meals on a regular schedule (especially lunch).  If a meal is missed or significantly delayed, your blood sugar could go low.  Please come back for a follow-up appointment in 2 months.

## 2021-04-04 ENCOUNTER — Ambulatory Visit: Payer: Managed Care, Other (non HMO) | Admitting: Endocrinology

## 2021-04-17 ENCOUNTER — Other Ambulatory Visit: Payer: Self-pay

## 2021-04-17 ENCOUNTER — Ambulatory Visit: Payer: Managed Care, Other (non HMO) | Admitting: Endocrinology

## 2021-04-17 VITALS — BP 130/80 | HR 77 | Ht 74.0 in | Wt 267.8 lb

## 2021-04-17 DIAGNOSIS — E1065 Type 1 diabetes mellitus with hyperglycemia: Secondary | ICD-10-CM

## 2021-04-17 DIAGNOSIS — E109 Type 1 diabetes mellitus without complications: Secondary | ICD-10-CM

## 2021-04-17 LAB — POCT GLYCOSYLATED HEMOGLOBIN (HGB A1C): Hemoglobin A1C: 8.5 % — AB (ref 4.0–5.6)

## 2021-04-17 MED ORDER — DEXCOM G6 TRANSMITTER MISC: 1.0000 | Freq: Once | 1 refills | Status: AC

## 2021-04-17 NOTE — Progress Notes (Signed)
Subjective:    Patient ID: David [name], male    DOB: Aug 19, 1995, 26 y.o.   MRN: [id number]  HPI Pt returns for f/u of diabetes mellitus:   DM type: 1 Dx'ed: 1583 Complications: none Therapy: insulin since dx (now Omnipod 5) DKA: never Severe hypoglycemia: never.   Pancreatitis: never. SDOH: he works as a Airline pilot, in [employer], but he lives in [place], on 1 day, then off 1-4 days.  he says glucose at work is similar to off days, but he seldom checks cbg.   Other: he uses an Omnipod pumps; he stopped using continuous glucose monitor.  Higher daytime basal does not start until noon, because breakfast is usually minimal.   Interval history:  He takes these pump settings: basal rate of 3 units/HR, 12MN-12N, 4.25 units/hr, 12N-7PM, and 3 units/HR, 7PM-12MN correction bolus (which some people call "sensitivity," or "insulin sensitivity ratio," or just "isr") of 1 unit for each 50 by which glucose exceeds 100.   We are unable to access pump data.  He stopped Trulicity, due to n/v.  He does not use auto mode.  It decreases 3AM-11AM.  he does not know why this is Past Medical History:  Diagnosis Date   Diabetes mellitus    Goiter    Gynecomastia    Hypoglycemia associated with diabetes ([place])    Hypothyroidism, acquired, autoimmune    Thyroiditis, autoimmune     No past surgical history on file.  Social History   Socioeconomic History   Marital status: Single    Spouse name: Not on file   Number of children: Not on file   Years of education: Not on file   Highest education level: Not on file  Occupational History   Not on file  Tobacco Use   Smoking status: Never   Smokeless tobacco: Never  Substance and Sexual Activity   Alcohol use: No   Drug use: No   Sexual activity: Yes    Partners: Female  Other Topics Concern   Not on file  Social History Narrative   Not on file   Social Determinants of Health   Financial Resource Strain: Not on file  Food Insecurity: Not  on file  Transportation Needs: Not on file  Physical Activity: Not on file  Stress: Not on file  Social Connections: Not on file  Intimate Partner Violence: Not on file    Current Outpatient Medications on File Prior to Visit  Medication Sig Dispense Refill   Continuous Blood Gluc Sensor (DEXCOM G6 SENSOR) MISC 1 Device by Does not apply route See admin instructions. Change every 10 days 9 each 3   Dulaglutide (TRULICITY) 3 EN/4.0HW SOPN Inject 3 mg as directed once a week. 6 mL 3   glucagon (GLUCAGON EMERGENCY) 1 MG injection Inject 16m intramuscular as needed for severe Hypoglecemia. Dispense 4= 30 days 4 each 4   glucose blood (FREESTYLE LITE) test strip CHECK BG 10X DAY 200 strip 10   Insulin Disposable Pump (OMNIPOD 5 G6 INTRO, GEN 5,) KIT 1 Device by Does not apply route every 36 (thirty-six) hours. 60 kit 3   Insulin Disposable Pump (OMNIPOD 5 G6 POD, GEN 5,) MISC 1 Device by Does not apply route every 3 (three) days. 30 each 3   insulin lispro (HUMALOG) 100 UNIT/ML injection FOR USE IN PUMP, TOTAL OF 120 UNITS PER DAY 120 mL 3   levothyroxine (SYNTHROID) 200 MCG tablet Take 1 tablet (200 mcg total) by mouth daily before breakfast. 90  tablet 3   No current facility-administered medications on file prior to visit.    No Known Allergies  Family History  Problem Relation Age of Onset   Diabetes Mother    Hypothyroidism Mother    Cancer Mother    Hypertension Mother    Cancer Brother    Hyperthyroidism Paternal Grandfather     BP 130/80    Pulse 77    Ht _0  (1.88 m)    Wt 267 lb 12.8 oz (121.5 kg)    SpO2 96%    BMI 34.38 kg/m    Review of Systems     Objective:   Physical Exam     Lab Results  Component Value Date   HGBA1C 8.5 (A) 04/17/2021     Assessment & Plan:  Type 1 DM: uncontrolled  Patient Instructions  check your blood sugar twice a day, to calibrate your monitor.  vary the time of day when you check, between before the 3 meals, and at bedtime.   also check if you have symptoms of your blood sugar being too high or too low.  please keep a record of the readings and bring it to your next appointment here (or you can bring the meter itself).  You can write it on any piece of paper.  please call us sooner if your blood sugar goes below 70, or if you have a lot of readings over 200.  Please take the these settings:  basal rate of 3 units/HR, 12MN-12N, 4.25 units/hr, 12N-7PM, and 3 units/HR, 7PM-12MN.   correction bolus (which some people call "sensitivity," or "insulin sensitivity ratio," or just "isr") of 1 unit for each 50 by which glucose exceeds 100.   On this type of pump schedule, you should eat meals on a regular schedule (especially lunch).  If a meal is missed or significantly delayed, your blood sugar could go low.   Vaughan Basta will call you about the pump issues.   Please come back for a follow-up appointment in 2 months.

## 2021-04-17 NOTE — Patient Instructions (Addendum)
check your blood sugar twice a day, to calibrate your monitor.  vary the time of day when you check, between before the 3 meals, and at bedtime.  also check if you have symptoms of your blood sugar being too high or too low.  please keep a record of the readings and bring it to your next appointment here (or you can bring the meter itself).  You can write it on any piece of paper.  please call us sooner if your blood sugar goes below 70, or if you have a lot of readings over 200.  Please take the these settings:  basal rate of 3 units/HR, 12MN-12N, 4.25 units/hr, 12N-7PM, and 3 units/HR, 7PM-12MN.   correction bolus (which some people call "sensitivity," or "insulin sensitivity ratio," or just "isr") of 1 unit for each 50 by which glucose exceeds 100.   On this type of pump schedule, you should eat meals on a regular schedule (especially lunch).  If a meal is missed or significantly delayed, your blood sugar could go low.   David [name] will call you about the pump issues.   Please come back for a follow-up appointment in 2 months.

## 2021-05-16 ENCOUNTER — Other Ambulatory Visit (HOSPITAL_COMMUNITY): Payer: Self-pay

## 2021-05-19 ENCOUNTER — Other Ambulatory Visit (HOSPITAL_COMMUNITY): Payer: Self-pay

## 2021-05-26 ENCOUNTER — Other Ambulatory Visit (HOSPITAL_COMMUNITY): Payer: Self-pay

## 2021-05-31 ENCOUNTER — Other Ambulatory Visit: Payer: Self-pay | Admitting: Endocrinology

## 2021-06-09 ENCOUNTER — Other Ambulatory Visit (HOSPITAL_COMMUNITY): Payer: Self-pay

## 2021-06-10 ENCOUNTER — Telehealth: Payer: Self-pay | Admitting: Pharmacy Technician

## 2021-06-10 ENCOUNTER — Other Ambulatory Visit (HOSPITAL_COMMUNITY): Payer: Self-pay

## 2021-06-10 NOTE — Telephone Encounter (Signed)
Patient Advocate Encounter ? ?Received notification from [employer] that prior authorization for TRULICITY 3MG  is required. ?  ?PA submitted on 4.4.23 ?Key [id number] ?Status is pending ?  ?[facility] will continue to follow ? ?Osric Klopf R Kinnie Kaupp, CPhT ?Patient Advocate ? Endocrinology ?Phone: [phone] ?Fax:  [phone] ? ?

## 2021-06-24 NOTE — Telephone Encounter (Signed)
Received a fax regarding Prior Authorization from [employer] for TRULICITY 3MG /0.5ML. Authorization has been DENIED because PT DOES NOT HAVE DIAGNOSIS OF TYPE 2 DIABETES. IF THIS IS A NEW START, ADDITIONALLY PATIENT MUST TRY/FAIL BYDUREON, BYETTA, OZEMPIC RYBELSUS, TRULICITY, AND VICTOZA. ? ? ?DENIAL LETTER HAS BEEN UPLOADED ? ?

## 2021-07-02 ENCOUNTER — Ambulatory Visit: Payer: Managed Care, Other (non HMO) | Admitting: Endocrinology

## 2021-11-26 ENCOUNTER — Other Ambulatory Visit: Payer: Self-pay

## 2021-11-26 DIAGNOSIS — E109 Type 1 diabetes mellitus without complications: Secondary | ICD-10-CM

## 2021-11-26 MED ORDER — DEXCOM G6 TRANSMITTER MISC: 0 refills | Status: DC

## 2021-12-04 ENCOUNTER — Other Ambulatory Visit (HOSPITAL_COMMUNITY): Payer: Self-pay

## 2021-12-04 ENCOUNTER — Ambulatory Visit: Payer: Managed Care, Other (non HMO) | Admitting: Internal Medicine

## 2021-12-04 ENCOUNTER — Telehealth: Payer: Self-pay

## 2021-12-04 ENCOUNTER — Encounter: Payer: Self-pay | Admitting: Internal Medicine

## 2021-12-04 VITALS — BP 130/78 | HR 74 | Ht 74.0 in | Wt 283.4 lb

## 2021-12-04 DIAGNOSIS — E063 Autoimmune thyroiditis: Secondary | ICD-10-CM

## 2021-12-04 DIAGNOSIS — E109 Type 1 diabetes mellitus without complications: Secondary | ICD-10-CM | POA: Diagnosis not present

## 2021-12-04 DIAGNOSIS — E10649 Type 1 diabetes mellitus with hypoglycemia without coma: Secondary | ICD-10-CM | POA: Diagnosis not present

## 2021-12-04 LAB — LIPID PANEL
Cholesterol: 225 mg/dL — ABNORMAL HIGH (ref 0–200)
HDL: 57.2 mg/dL (ref >39.00)
LDL Cholesterol: 152 mg/dL — ABNORMAL HIGH (ref 0–99)
NonHDL: 167.32
Total CHOL/HDL Ratio: 4
Triglycerides: 77 mg/dL (ref 0.0–149.0)
VLDL: 15.4 mg/dL (ref 0.0–40.0)

## 2021-12-04 LAB — BASIC METABOLIC PANEL
BUN: 8 mg/dL (ref 6–23)
CO2: 26 mEq/L (ref 19–32)
Calcium: 9.2 mg/dL (ref 8.4–10.5)
Chloride: 101 mEq/L (ref 96–112)
Creatinine, Ser: 1.17 mg/dL (ref 0.40–1.50)
GFR: 85.33 mL/min (ref >60.00)
Glucose, Bld: 279 mg/dL — ABNORMAL HIGH (ref 70–99)
Potassium: 4 mEq/L (ref 3.5–5.1)
Sodium: 135 mEq/L (ref 135–145)

## 2021-12-04 LAB — TSH: TSH: 54.99 u[IU]/mL — ABNORMAL HIGH (ref 0.35–5.50)

## 2021-12-04 LAB — POCT GLYCOSYLATED HEMOGLOBIN (HGB A1C): Hemoglobin A1C: 8.3 % — AB (ref 4.0–5.6)

## 2021-12-04 LAB — MICROALBUMIN / CREATININE URINE RATIO
Creatinine,U: 116.7 mg/dL
Microalb Creat Ratio: 0.6 mg/g (ref 0.0–30.0)
Microalb, Ur: <0.7 mg/dL (ref 0.0–1.9)

## 2021-12-04 MED ORDER — OMNIPOD 5 DEXG7G6 PODS GEN 5 MISC: 1.0000 | 3 refills | Status: DC

## 2021-12-04 MED ORDER — DEXCOM G6 SENSOR MISC: 1.0000 | 3 refills | Status: DC

## 2021-12-04 MED ORDER — DEXCOM G6 TRANSMITTER MISC: 3 refills | Status: DC

## 2021-12-04 MED ORDER — SEMAGLUTIDE(0.25 OR 0.5MG/DOS) 2 MG/3ML ~~LOC~~ SOPN: 0.5000 mg | PEN_INJECTOR | SUBCUTANEOUS | 2 refills | Status: DC

## 2021-12-04 MED ORDER — INSULIN LISPRO 100 UNIT/ML IJ SOLN: INTRAMUSCULAR | 3 refills | Status: DC

## 2021-12-04 NOTE — Telephone Encounter (Signed)
Patient Advocate Encounter   Received notification that prior authorization for Ozempic (0.25 or 0.5 MG/DOSE) 2MG /3ML pen-injectors is required/requested.   PA submitted on 12/04/21 to [facility] via [facility]  Status is pending

## 2021-12-04 NOTE — Patient Instructions (Signed)
-   Start Ozempic 0.25 mg once weekly for 6 weeks, you may increase to 0.5 once weekly after that    HOW TO TREAT LOW BLOOD SUGARS (Blood sugar LESS THAN 70 MG/DL) Please follow the RULE OF 15 for the treatment of hypoglycemia treatment (when your (blood sugars are less than 70 mg/dL)   STEP 1: Take 15 grams of carbohydrates when your blood sugar is low, which includes:  3-4 GLUCOSE TABS  OR 3-4 OZ OF JUICE OR REGULAR SODA OR ONE TUBE OF GLUCOSE GEL    STEP 2: RECHECK blood sugar in 15 MINUTES STEP 3: If your blood sugar is still low at the 15 minute recheck --> then, go back to STEP 1 and treat AGAIN with another 15 grams of carbohydrates.

## 2021-12-04 NOTE — Progress Notes (Signed)
Name: David [name]  Age/ Sex: 26 y.o., male   MRN/ DOB: [id number], March 25, 1995     PCP: Patient, No Pcp Per   Reason for Endocrinology Evaluation: Type 1 Diabetes Mellitus  Initial Endocrine Consultative Visit: 07/06/2016    PATIENT IDENTIFIER: David [name] is a 26 y.o. male with a past medical history of TDM. The patient has followed with Endocrinology clinic since 07/06/2016 for consultative assistance with management of his diabetes.     DIABETIC HISTORY:  David [name] was diagnosed with DM 2007. His hemoglobin A1c has ranged from 7.7% in 2022, peaking at 13.0% in 2014.  Developed side effects to Trulicity due to GI effects    THYROID HISTORY: Has been on LT-4 replacement since middle school .   SUBJECTIVE:   During the last visit (04/17/2021): saw Dr. Everardo All      Today (12/04/2021): David [name] is here for a follow up on diabetes management.  He checks his blood sugars multiple  times daily. The patient has  had hypoglycemic episodes since the last clinic visit, which typically occur at variable  times .   This patient with type 1 diabetes is treated with Omnipod  (insulin pump). During the visit the pump basal and bolus doses were reviewed including carb/insulin rations and supplemental doses. The clinical list was updated. The glucose meter download was reviewed in detail to determine if the current pump settings are providing the best glycemic control without excessive hypoglycemia.    He eats 2-3 meals a day Snacks 2-3 a day  Denies nausea, vomitin or diarrhea    Pump and meter download:    Pump   Omnipod 5 Settings   Insulin type   Humalog    Basal rate       0000 2.65 u/h    0400 2.25   1200 4.20   2000 3.45  I:C ratio       0000 1:1                  Sensitivity       0000  30      Goal       0000  110             Type & Model of Pump: Omnipod  Insulin Type: Currently using Humalog .  Body mass index is 36.39  kg/m.  PUMP STATISTICS:unable to connect       HOME DIABETES REGIMEN:  Trulicity 3 mg weekly - not taking  Humalog     Statin: no ACE-I/ARB: no    CONTINUOUS GLUCOSE MONITORING RECORD INTERPRETATION    Dates of Recording: 9/4-9/17/2023  Sensor description:dexcom  Results statistics:   CGM use % of time 93  Average and SD 215/85  Time in range    33    %  % Time Above 180 26  % Time above 250 38  % Time Below target 2   Glycemic patterns summary: hyperglycemia noted during the day and night   Hyperglycemic episodes  most day and night   Hypoglycemic episodes occurred variable   Overnight periods: variable      DIABETIC COMPLICATIONS: Microvascular complications:   Denies: CKD Last Eye Exam: Completed 2023  Macrovascular complications:   Denies: CAD, CVA, PVD   HISTORY:  Past Medical History:  Past Medical History:  Diagnosis Date   Diabetes mellitus    Goiter    Gynecomastia    Hypoglycemia associated with diabetes ([facility])  Hypothyroidism, acquired, autoimmune    Thyroiditis, autoimmune    Past Surgical History: No past surgical history on file. Social History:  reports that he has never smoked. He has never used smokeless tobacco. He reports that he does not drink alcohol and does not use drugs. Family History:  Family History  Problem Relation Age of Onset   Diabetes Mother    Hypothyroidism Mother    Cancer Mother    Hypertension Mother    Cancer Brother    Hyperthyroidism Paternal Grandfather      HOME MEDICATIONS: Allergies as of 12/04/2021   No Known Allergies      Medication List        Accurate as of December 04, 2021 12:04 PM. If you have any questions, ask your nurse or doctor.          STOP taking these medications    Trulicity 3 PY/1.9JK Sopn Generic drug: Dulaglutide Stopped by: Dorita Sciara, MD       TAKE these medications    Dexcom G6 Sensor Misc 1 Device by Does not apply route See  admin instructions. Change every 10 days   Dexcom G6 Transmitter Misc Use as instructed to check blood sugars, replace every 3 months   FREESTYLE LITE test strip Generic drug: glucose blood CHECK BG 10X DAY   glucagon 1 MG injection Inject 1mg  intramuscular as needed for severe Hypoglecemia. Dispense 4= 30 days   insulin lispro 100 UNIT/ML injection Commonly known as: HumaLOG FOR USE IN PUMP, TOTAL OF 120 UNITS PER DAY   levothyroxine 200 MCG tablet Commonly known as: SYNTHROID TAKE 1 TABLET (200 MCG TOTAL) BY MOUTH DAILY BEFORE BREAKFAST.   Omnipod 5 G6 Pod (Gen 5) Misc 1 Device by Does not apply route every other day. What changed:  when to take this Another medication with the same name was removed. Continue taking this medication, and follow the directions you see here. Changed by: Dorita Sciara, MD   Semaglutide(0.25 or 0.5MG /DOS) 2 MG/3ML Sopn Inject 0.5 mg into the skin once a week. Started by: Dorita Sciara, MD         OBJECTIVE:   Vital Signs: BP 130/78 (BP Location: Left Arm, Patient Position: Sitting, Cuff Size: Normal)   Pulse 74   Ht 6\' 2"  (1.88 m)   Wt 283 lb 6.4 oz (128.5 kg)   SpO2 98%   BMI 36.39 kg/m   Wt Readings from Last 3 Encounters:  12/04/21 283 lb 6.4 oz (128.5 kg)  04/17/21 267 lb 12.8 oz (121.5 kg)  12/30/20 245 lb 9.6 oz (111.4 kg)     Exam: General: Pt appears well and is in NAD  Neck: General: Supple without adenopathy. Thyroid: Thyroid size normal.  No goiter or nodules appreciated.   Lungs: Clear with good BS bilat   Heart: RRR   Abdomen:  soft, nontender  Extremities: No pretibial edema.   Neuro: MS is good with appropriate affect, pt is alert and Ox3          DATA REVIEWED:  Lab Results  Component Value Date   HGBA1C 8.3 (A) 12/04/2021   HGBA1C 8.5 (A) 04/17/2021   HGBA1C 8.7 (A) 12/30/2020    Latest Reference Range & Units 12/04/21 12:12  Sodium 135 - 145 mEq/L 135  Potassium 3.5 - 5.1 mEq/L  4.0  Chloride 96 - 112 mEq/L 101  CO2 19 - 32 mEq/L 26  Glucose 70 - 99 mg/dL 279 (H)  BUN 6 -  23 mg/dL 8  Creatinine 3.83 - 2.91 mg/dL 9.16  Calcium 8.4 - 60.6 mg/dL 9.2  GFR >00.45 mL/min 85.33  Total CHOL/HDL Ratio  4  Cholesterol 0 - 200 mg/dL 997 (H)  HDL Cholesterol >39.00 mg/dL 74.14  LDL (calc) 0 - 99 mg/dL 239 (H)  MICROALB/CREAT RATIO 0.0 - 30.0 mg/g 0.6  NonHDL  167.32  Triglycerides 0.0 - 149.0 mg/dL 53.2  VLDL 0.0 - 02.3 mg/dL 34.3     Latest Reference Range & Units 12/04/21 12:12  TSH 0.35 - 5.50 uIU/mL 54.99 (H)    Latest Reference Range & Units 12/04/21 12:12  Creatinine,U mg/dL 568.6  Microalb, Ur 0.0 - 1.9 mg/dL <1.6  MICROALB/CREAT RATIO 0.0 - 30.0 mg/g 0.6      ASSESSMENT / PLAN / RECOMMENDATIONS:   1) Type 1 Diabetes Mellitus, poorly controlled, Without complications - Most recent A1c of 8.3 %. Goal A1c < 7.0 %.     -A1c continues to be above goal -We discussed the risk of microvascular complication with uncontrolled diabetes -He was provided with a pamphlet to register his pump so we are able to download it in the future -Based on CGM data it appears the patient is guarding on his basal rate, hence glycemic excursions between hyperglycemia and hypoglycemia -We discussed the importance of entering CHO intake, he is not used to counting carbs and at this time he was advised to enter #10 grams with each meal -He was intolerant to Trulicity 4.5 mg weekly, we will start Ozempic as below, he understands that he could have GI side effects to Ozempic as well  -I am going to reduce his basal rate as well as adjust his insulin to carb ratio -BMP, MA/CR ratio normal    MEDICATIONS: Start Ozempic 0.25 mg weekly, increase to 0.5 mg weekly after 6-week if no side effects  Pump   Omnipod 5 Settings   Insulin type   Humalog    Basal rate       0000 2.55 u/h    0400 2.20   1200 4.15   2000 3.40  I:C ratio       0000 1:1          Sensitivity       0000   30      Goal       0000  110           EDUCATION / INSTRUCTIONS: BG monitoring instructions: Patient is instructed to check his blood sugars 3 times a day, before meals. Call [facility] if: BG persistently < 70  I reviewed the Rule of 15 for the treatment of hypoglycemia in detail with the patient. Literature supplied.    2) Diabetic complications:  Eye: Does not have known diabetic retinopathy.  Neuro/ Feet: Does not have known diabetic peripheral neuropathy .  Renal: Patient does not have known baseline CKD. He   is not on an ACEI/ARB at present.    3) Hypothyroidism:   -His TSH is extremely high despite being on the same dose of levothyroxine, suspect imperfect adherence to levothyroxine -Patient will be contacted to confirm adherence prior to making any changes to his levothyroxine dose Medication Levothyroxine 200 mcg daily  4)Dyslipidemia:  -LDL elevated, but there is no indication for statin therapy at this time, will encourage low-fat diet   F/U in 4 months     Signed electronically by: Lyndle Herrlich, MD  [facility]  [facility] [address], [address] [place], [place] [address] Phone: [phone] FAX: [phone]   CC: Patient, No Pcp Per No address on file Phone: None  Fax: None  Return to Endocrinology clinic as below: Future Appointments  Date Time Provider Department Center  04/23/2022  8:10 AM Valerie Cones, Konrad Dolores, MD [facility] None

## 2021-12-05 ENCOUNTER — Telehealth: Payer: Self-pay | Admitting: Internal Medicine

## 2021-12-05 ENCOUNTER — Encounter: Payer: Self-pay | Admitting: Internal Medicine

## 2021-12-05 NOTE — Telephone Encounter (Signed)
Can you please call the patient and ask him if he has been taking levothyroxine consistently??   His thyroid test is extremely high which indicates he is not on enough or he is not taking it   But before I go making changes I want to make sure that he is consistently taking it  Let him know that his cholesterol is high and needs to reduce the amount of red meat and fried food    Kidney function and urine test are normal   Thanks

## 2021-12-05 NOTE — Telephone Encounter (Signed)
LMTCB

## 2021-12-08 NOTE — Telephone Encounter (Signed)
Patient Advocate Encounter  Received notification from [employer] that the request for prior authorization for Ozempic (0.25 or 0.5 MG/DOSE) 2MG /3ML pen-injectors has been denied due to There is no indication your patient has type 2 diabetes mellitus. [employer] does not cover Bydureon, Byetta, Ozempic, Rybelsus, Trulicity or Victoza for any other indication because it is only approved  for type 2 diabetes mellitus.

## 2021-12-08 NOTE — Telephone Encounter (Signed)
LMTCB

## 2021-12-09 MED ORDER — LEVOTHYROXINE SODIUM 200 MCG PO TABS: 200.0000 ug | ORAL_TABLET | Freq: Every day | ORAL | 3 refills | Status: DC

## 2021-12-09 MED ORDER — LEVOTHYROXINE SODIUM 25 MCG PO TABS: 25.0000 ug | ORAL_TABLET | Freq: Every day | ORAL | 2 refills | Status: DC

## 2021-12-09 NOTE — Telephone Encounter (Signed)
I have contacted [facility] for pickup history on levothyroxine as below    03/02/2021 #90 06/12/2021  #90 09/23/2021 #90     It appears that the patient is behind on picking up his prescriptions between 10-14 days  Since his TSH is very elevated, I will add levothyroxine 25 mcg to his 200 mcg dose and will emphasize using a pillbox  As I suspect he may not need the 25 mcg dose long-term  Abby Nena Casen, MD  [facility]  [facility] [address] [place], [place] [address] Phone: [phone] FAX: [phone]

## 2021-12-09 NOTE — Telephone Encounter (Signed)
Patient advise and verbalized understanding. 

## 2021-12-09 NOTE — Telephone Encounter (Signed)
Patient states that he missed 2 days but has been taking it consistently.

## 2021-12-09 NOTE — Addendum Note (Signed)
Addended by: Dorita Sciara on: 12/09/2021 02:19 PM   Modules accepted: Orders

## 2021-12-10 ENCOUNTER — Other Ambulatory Visit: Payer: Managed Care, Other (non HMO)

## 2022-04-23 ENCOUNTER — Encounter: Payer: Self-pay | Admitting: Internal Medicine

## 2022-04-23 ENCOUNTER — Ambulatory Visit: Payer: Managed Care, Other (non HMO) | Admitting: Internal Medicine

## 2022-04-23 VITALS — BP 132/80 | HR 73 | Ht 74.0 in | Wt 283.0 lb

## 2022-04-23 DIAGNOSIS — E10649 Type 1 diabetes mellitus with hypoglycemia without coma: Secondary | ICD-10-CM | POA: Diagnosis not present

## 2022-04-23 DIAGNOSIS — E063 Autoimmune thyroiditis: Secondary | ICD-10-CM | POA: Diagnosis not present

## 2022-04-23 DIAGNOSIS — E109 Type 1 diabetes mellitus without complications: Secondary | ICD-10-CM

## 2022-04-23 LAB — POCT GLYCOSYLATED HEMOGLOBIN (HGB A1C): Hemoglobin A1C: 7.9 % — AB (ref 4.0–5.6)

## 2022-04-23 NOTE — Patient Instructions (Signed)
[facility] Address:  [address]  [phone]    - HOW TO TREAT LOW BLOOD SUGARS (Blood sugar LESS THAN 70 MG/DL) Please follow the RULE OF 15 for the treatment of hypoglycemia treatment (when your (blood sugars are less than 70 mg/dL)   STEP 1: Take 15 grams of carbohydrates when your blood sugar is low, which includes:  3-4 GLUCOSE TABS  OR 3-4 OZ OF JUICE OR REGULAR SODA OR ONE TUBE OF GLUCOSE GEL    STEP 2: RECHECK blood sugar in 15 MINUTES STEP 3: If your blood sugar is still low at the 15 minute recheck --> then, go back to STEP 1 and treat AGAIN with another 15 grams of carbohydrates.

## 2022-04-23 NOTE — Progress Notes (Signed)
Name: David [name]  Age/ Sex: 27 y.o., male   MRN/ DOB: [id number], 05/25/1995     PCP: Patient, No Pcp Per   Reason for Endocrinology Evaluation: Type 1 Diabetes Mellitus  Initial Endocrine Consultative Visit: 07/06/2016    PATIENT IDENTIFIER: [name] is a 27 y.o. male with a past medical history of TDM. The patient has followed with Endocrinology clinic since 07/06/2016 for consultative assistance with management of his diabetes.     DIABETIC HISTORY:  David [name] was diagnosed with DM 2007. His hemoglobin A1c has ranged from 7.7% in 2022, peaking at 13.0% in 2014.  Developed side effects to Trulicity due to GI effects      THYROID HISTORY: Has been on LT-4 replacement since middle school .  He was followed by Dr. Loanne Drilling from 2018 until 2023   On his initial visit with me he had an A1c of 8.3% while on pump technology, he was started on Ozempic   SUBJECTIVE:   During the last visit (12/04/2021): A1c 8.3%   Today (04/23/2022): David [name] is here for a follow up on diabetes management.  He checks his blood sugars multiple  times daily. The patient has  had hypoglycemic episodes since the last clinic visit, which typically occur at variable  times .   This patient with type 1 diabetes is treated with Omnipod  (insulin pump). During the visit the pump basal and bolus doses were reviewed including carb/insulin rations and supplemental doses. The clinical list was updated. The glucose meter download was reviewed in detail to determine if the current pump settings are providing the best glycemic control without excessive hypoglycemia.    Pump and meter download:    Pump   Omnipod 5 Settings   Insulin type   Humalog    Basal rate       0000 2.55 u/h    0400 2.20   1200 4.15   2000 3.40  I:C ratio       0000 1:1 #10 g                  Sensitivity       0000  30      Goal       0000  110             Type & Model of Pump: Omnipod   Insulin Type: Currently using Humalog .  Body mass index is 36.34 kg/m.  PUMP STATISTICS:unable to connect       HOME DIABETES REGIMEN:  Ozempic 0.25 mg weekly - not taking  Humalog     Statin: no ACE-I/ARB: no    CONTINUOUS GLUCOSE MONITORING RECORD INTERPRETATION    Dates of Recording: 2/2-2/15/2024  Sensor description:dexcom  Results statistics:   CGM use % of time 93  Average and SD 224/81  Time in range   32  %  % Time Above 180 25  % Time above 250 42  % Time Below target <1   Glycemic patterns summary: hyperglycemia noted during the day and night   Hyperglycemic episodes  most day and night   Hypoglycemic episodes occurred after bolus  Overnight periods: High and trends down     DIABETIC COMPLICATIONS: Microvascular complications:   Denies: CKD Last Eye Exam: Completed 2023  Macrovascular complications:   Denies: CAD, CVA, PVD   HISTORY:  Past Medical History:  Past Medical History:  Diagnosis Date   Diabetes mellitus    Goiter  Gynecomastia    Hypoglycemia associated with diabetes ([place])    Hypothyroidism, acquired, autoimmune    Thyroiditis, autoimmune    Past Surgical History: No past surgical history on file. Social History:  reports that he has never smoked. He has never used smokeless tobacco. He reports that he does not drink alcohol and does not use drugs. Family History:  Family History  Problem Relation Age of Onset   Diabetes Mother    Hypothyroidism Mother    Cancer Mother    Hypertension Mother    Cancer Brother    Hyperthyroidism Paternal Grandfather      HOME MEDICATIONS: Allergies as of 04/23/2022   No Known Allergies      Medication List        Accurate as of April 23, 2022  8:31 AM. If you have any questions, ask your nurse or doctor.          STOP taking these medications    FREESTYLE LITE test strip Generic drug: glucose blood Stopped by: Dorita Sciara, MD       TAKE  these medications    Dexcom G6 Sensor Misc 1 Device by Does not apply route See admin instructions. Change every 10 days   Dexcom G6 Transmitter Misc Use as instructed to check blood sugars, replace every 3 months   glucagon 1 MG injection Inject 74m intramuscular as needed for severe Hypoglecemia. Dispense 4= 30 days   insulin lispro 100 UNIT/ML injection Commonly known as: HumaLOG FOR USE IN PUMP, TOTAL OF 120 UNITS PER DAY   levothyroxine 25 MCG tablet Commonly known as: SYNTHROID Take 1 tablet (25 mcg total) by mouth daily. To be taking in addition to levothyroxine 200 mcg daily   levothyroxine 200 MCG tablet Commonly known as: SYNTHROID Take 1 tablet (200 mcg total) by mouth daily before breakfast.   Omnipod 5 G6 Pods (Gen 5) Misc 1 Device by Does not apply route every other day.   Semaglutide(0.25 or 0.5MG/DOS) 2 MG/3ML Sopn Inject 0.5 mg into the skin once a week.         OBJECTIVE:   Vital Signs: BP 132/80 (BP Location: Left Arm, Patient Position: Sitting, Cuff Size: Large)   Pulse 73   Ht 6' 2"$  (1.88 m)   Wt 283 lb (128.4 kg)   SpO2 97%   BMI 36.34 kg/m   Wt Readings from Last 3 Encounters:  04/23/22 283 lb (128.4 kg)  12/04/21 283 lb 6.4 oz (128.5 kg)  04/17/21 267 lb 12.8 oz (121.5 kg)     Exam: General: Pt appears well and is in NAD  Neck: General: Supple without adenopathy. Thyroid: Thyroid size normal.  No goiter or nodules appreciated.   Lungs: Clear with good BS bilat   Heart: RRR   Abdomen:  soft, nontender  Extremities: No pretibial edema.   Neuro: MS is good with appropriate affect, pt is alert and Ox3          DATA REVIEWED:  Lab Results  Component Value Date   HGBA1C 7.9 (A) 04/23/2022   HGBA1C 8.3 (A) 12/04/2021   HGBA1C 8.5 (A) 04/17/2021    Latest Reference Range & Units 12/04/21 12:12  Sodium 135 - 145 mEq/L 135  Potassium 3.5 - 5.1 mEq/L 4.0  Chloride 96 - 112 mEq/L 101  CO2 19 - 32 mEq/L 26  Glucose 70 - 99 mg/dL  279 (H)  BUN 6 - 23 mg/dL 8  Creatinine 0.40 - 1.50 mg/dL 1.17  Calcium  8.4 - 10.5 mg/dL 9.2  GFR >60.00 mL/min 85.33  Total CHOL/HDL Ratio  4  Cholesterol 0 - 200 mg/dL 225 (H)  HDL Cholesterol >39.00 mg/dL 57.20  LDL (calc) 0 - 99 mg/dL 152 (H)  MICROALB/CREAT RATIO 0.0 - 30.0 mg/g 0.6  NonHDL  167.32  Triglycerides 0.0 - 149.0 mg/dL 77.0  VLDL 0.0 - 40.0 mg/dL 15.4     Latest Reference Range & Units 12/04/21 12:12  TSH 0.35 - 5.50 uIU/mL 54.99 (H)    Latest Reference Range & Units 12/04/21 12:12  Creatinine,U mg/dL 116.7  Microalb, Ur 0.0 - 1.9 mg/dL <0.7  MICROALB/CREAT RATIO 0.0 - 30.0 mg/g 0.6      ASSESSMENT / PLAN / RECOMMENDATIONS:   1) Type 1 Diabetes Mellitus, Sub-optimaly   controlled, Without complications - Most recent A1c of 7.9 %. Goal A1c < 7.0 %.     -A1c is trending down but continues to be above goal -He continues not to bolus and depends on basal rate and correction -He is not in the automatic mode, because the automatic mode makes his BGs fluctuate, I did explain to the patient the benefit of automatic mode and we used last night as an example because his BG increased 325 mg/dL after supper, and it remained high for another 7 hours before it started to trend down to 200 mg/DL, with automatic mode his BG's would have trended down much quicker -We again discussed the importance of optimizing glucose control to prevent microvascular complications such as amputations -We again, were unable to download his OmniPod, on his last visit he was given a step-by-step pamphlet on registering his pump, the patient stated that he was unable to completed due to issues with either the site or the computer?  Today he was advised to contact OmniPod to the phone and request that help and uploading his information so we are able to download it in the future to make better management decisions -He was intolerant to Trulicity 4.5 mg weekly, we attempted to prescribe Ozempic but  this was not covered by his insurance as he is a type I DM. -BMP, MA/CR ratio normal in 11/2022 -Patient has lab orders to his work for physical, the paper clearly indicates [facility] with good numbers, I provided him with the address to a local [facility]   MEDICATIONS: Humalog  Pump   Omnipod 5 Settings   Insulin type   Humalog    Basal rate       0000 2.55 u/h    0400 2.20   1200 4.15   2000 3.40  I:C ratio       0000 1:1          Sensitivity       0000  30      Goal       0000  110           EDUCATION / INSTRUCTIONS: BG monitoring instructions: Patient is instructed to check his blood sugars 3 times a day, before meals. Call [facility] if: BG persistently < 70  I reviewed the Rule of 15 for the treatment of hypoglycemia in detail with the patient. Literature supplied.    2) Diabetic complications:  Eye: Does not have known diabetic retinopathy.  Neuro/ Feet: Does not have known diabetic peripheral neuropathy .  Renal: Patient does not have known baseline CKD. He   is not on an ACEI/ARB at present.    3) Hypothyroidism:  - Pt with imprefect  adherence to levothyroxine , nonadherence has been confirmed to pick up history from the pharmacy -He does admit to imperfect adherence to levothyroxine, I have strongly encouraged the patient to use a pillbox to take levothyroxine -We discussed the effects of uncontrolled hypothyroid including fatigue and myxedema,  Medication Levothyroxine 200 mcg daily Levothyroxine 25 mcg daily    4)Dyslipidemia:  -LDL elevated, but there is no indication for statin therapy at this time, will encourage low-fat diet   F/U in 6 months     Signed electronically by: Mack Guise, MD  [facility] Endocrinology  [facility] [address] [place], [place] [address] Phone: [phone] FAX: [phone]   CC: Patient, No Pcp Per No address on file Phone: None  Fax: None  Return  to Endocrinology clinic as below: No future appointments.

## 2022-05-26 ENCOUNTER — Telehealth: Payer: Self-pay

## 2022-05-26 NOTE — Telephone Encounter (Signed)
Clinical notes sent to [place] [employer] per fax received.

## 2022-05-29 ENCOUNTER — Other Ambulatory Visit: Payer: Self-pay | Admitting: Internal Medicine

## 2022-05-29 DIAGNOSIS — E109 Type 1 diabetes mellitus without complications: Secondary | ICD-10-CM

## 2022-06-24 ENCOUNTER — Other Ambulatory Visit: Payer: Self-pay

## 2022-06-24 ENCOUNTER — Other Ambulatory Visit (HOSPITAL_COMMUNITY): Payer: Self-pay

## 2022-06-24 ENCOUNTER — Other Ambulatory Visit: Payer: Self-pay | Admitting: Internal Medicine

## 2022-06-24 ENCOUNTER — Telehealth: Payer: Self-pay

## 2022-06-24 MED ORDER — INSULIN ASPART 100 UNIT/ML IJ SOLN: INTRAMUSCULAR | 3 refills | Status: DC

## 2022-06-24 NOTE — Telephone Encounter (Signed)
Patient advised and script sent to [facility] on [address] requested by patient

## 2022-06-24 NOTE — Telephone Encounter (Signed)
PA request received via CMM for HumaLOG 100UNIT/ML solution  *preferred med is Novolog, pending possible med change  Key: [id number]

## 2022-09-02 ENCOUNTER — Other Ambulatory Visit (HOSPITAL_COMMUNITY): Payer: Self-pay

## 2022-09-03 ENCOUNTER — Other Ambulatory Visit: Payer: Self-pay

## 2022-09-03 MED ORDER — LEVOTHYROXINE SODIUM 200 MCG PO TABS: 200.0000 ug | ORAL_TABLET | Freq: Every day | ORAL | 0 refills | Status: DC

## 2022-09-04 ENCOUNTER — Other Ambulatory Visit (HOSPITAL_COMMUNITY): Payer: Self-pay

## 2022-09-04 ENCOUNTER — Telehealth: Payer: Self-pay | Admitting: Pharmacy Technician

## 2022-09-04 ENCOUNTER — Other Ambulatory Visit: Payer: Self-pay | Admitting: Internal Medicine

## 2022-09-04 MED ORDER — INSULIN LISPRO 100 UNIT/ML IJ SOLN: INTRAMUSCULAR | 3 refills | Status: DC

## 2022-09-04 MED ORDER — EMPAGLIFLOZIN 25 MG PO TABS: 25.0000 mg | ORAL_TABLET | Freq: Every day | ORAL | 3 refills | Status: DC

## 2022-09-04 NOTE — Telephone Encounter (Signed)
Pharmacy Patient Advocate Encounter   Received notification from CoverMyMeds that prior authorization for Novolog is required/requested.   Insurance verification completed.  They have had a slight change. And now the prefer Humalog again. The patient is insured through [employer] .   Per test claim:   Humalog or generic is preferred by the ins.  If suggested medication is appropriate, Please send in a new RX and discontinue this one. If not, please advise as to why it's not appropriate so that we may request a Prior Authorization.

## 2022-10-16 ENCOUNTER — Other Ambulatory Visit: Payer: Self-pay

## 2022-10-16 MED ORDER — INSULIN LISPRO 100 UNIT/ML IJ SOLN: INTRAMUSCULAR | 3 refills | Status: DC

## 2022-10-21 NOTE — Progress Notes (Unsigned)
Name: David C Mauritz  Age/ Sex: 27 y.o., male   MRN/ DOB: [id number], 10-Dec-1995     PCP: Patient, No Pcp Per   Reason for Endocrinology Evaluation: Type 1 Diabetes Mellitus  Initial Endocrine Consultative Visit: 07/06/2016    PATIENT IDENTIFIER: [name] is a 27 y.o. male with a past medical history of TDM. The patient has followed with Endocrinology clinic since 07/06/2016 for consultative assistance with management of his diabetes.     DIABETIC HISTORY:  [name] was diagnosed with DM 2007. His hemoglobin A1c has ranged from 7.7% in 2022, peaking at 13.0% in 2014.  Developed side effects to Trulicity due to GI effects     THYROID HISTORY: Has been on LT-4 replacement since middle school .  He was followed by Dr. Everardo All from 2018 until 2023   On his initial visit with me he had an A1c of 8.3% while on pump technology, he was started on Ozempic   SUBJECTIVE:   During the last visit (04/23/2022): A1c 7.9%    Today (10/22/2022): David [name] is here for a follow up on diabetes management.  He checks his blood sugars multiple  times daily. The patient has  had hypoglycemic episodes since the last clinic visit, which typically occur at variable  times .   Patient on PPI for GERD symptoms, is asking for higher dose He has been more compliant with levothyroxine Has not been able to contact OmniPod   Pump and meter download:    Pump   Omnipod 5 Settings   Insulin type   Humalog    Basal rate       0000 2.8u/h    0400 2.6   1200 4.2   2000 3.45  I:C ratio       0000 1:1 #10 g                  Sensitivity       0000  30      Goal       0000  110             Type & Model of Pump: Omnipod  Insulin Type: Currently using Humalog .  Body mass index is 35.95 kg/m.  PUMP STATISTICS:unable to connect       HOME DIABETES REGIMEN:    Humalog     Statin: no ACE-I/ARB: no    CONTINUOUS GLUCOSE MONITORING RECORD  INTERPRETATION    Dates of Recording: 8/2-8/15/2024  Sensor description:dexcom  Results statistics:   CGM use % of time 93  Average and SD 206/75  Time in range  36 %  % Time Above 180 35  % Time above 250 28  % Time Below target 1   Glycemic patterns summary: BG's trend down overnight and fluctuate during the day Hyperglycemic episodes postprandial  Hypoglycemic episodes occurred at variable times without a pattern  Overnight periods: Variable without a pattern     DIABETIC COMPLICATIONS: Microvascular complications:   Denies: CKD Last Eye Exam: Completed 2023  Macrovascular complications:   Denies: CAD, CVA, PVD   HISTORY:  Past Medical History:  Past Medical History:  Diagnosis Date   Diabetes mellitus    Goiter    Gynecomastia    Hypoglycemia associated with diabetes ([facility])    Hypothyroidism, acquired, autoimmune    Thyroiditis, autoimmune    Past Surgical History: No past surgical history on file. Social History:  reports that he has never smoked. He has  never used smokeless tobacco. He reports that he does not drink alcohol and does not use drugs. Family History:  Family History  Problem Relation Age of Onset   Diabetes Mother    Hypothyroidism Mother    Cancer Mother    Hypertension Mother    Cancer Brother    Hyperthyroidism Paternal Grandfather      HOME MEDICATIONS: Allergies as of 10/22/2022   No Known Allergies      Medication List        Accurate as of October 22, 2022  8:37 AM. If you have any questions, ask your nurse or doctor.          Dexcom G6 Sensor Misc 1 Device by Does not apply route See admin instructions. Change every 10 days   Dexcom G6 Transmitter Misc USE AS INSTRUCTED TO CHECK BLOOD SUGARS, REPLACE EVERY 3 MONTHS   empagliflozin 25 MG Tabs tablet Commonly known as: Jardiance Take 1 tablet (25 mg total) by mouth daily before breakfast.   glucagon 1 MG injection Inject 1mg  intramuscular as needed for  severe Hypoglecemia. Dispense 4= 30 days   insulin lispro 100 UNIT/ML injection Commonly known as: HumaLOG Max daily 120 units   levothyroxine 25 MCG tablet Commonly known as: SYNTHROID Take 1 tablet (25 mcg total) by mouth daily. To be taking in addition to levothyroxine 200 mcg daily   levothyroxine 200 MCG tablet Commonly known as: SYNTHROID Take 1 tablet (200 mcg total) by mouth daily before breakfast.   omeprazole 20 MG capsule Commonly known as: PRILOSEC Take 20 mg by mouth daily.   Omnipod 5 G6 Pods (Gen 5) Misc 1 Device by Does not apply route every other day.         OBJECTIVE:   Vital Signs: BP 120/80 (BP Location: Left Arm, Patient Position: Sitting, Cuff Size: Large)   Pulse 76   Ht 6\' 2"  (1.88 m)   Wt 280 lb (127 kg)   SpO2 99%   BMI 35.95 kg/m   Wt Readings from Last 3 Encounters:  10/22/22 280 lb (127 kg)  04/23/22 283 lb (128.4 kg)  12/04/21 283 lb 6.4 oz (128.5 kg)     Exam: General: Pt appears well and is in NAD  Lungs: Clear with good BS bilat   Heart: RRR   Abdomen:  soft, nontender  Extremities: Trace pretibial edema.   Neuro: MS is good with appropriate affect, pt is alert and Ox3          DATA REVIEWED:  Lab Results  Component Value Date   HGBA1C 7.9 (A) 04/23/2022   HGBA1C 8.3 (A) 12/04/2021   HGBA1C 8.5 (A) 04/17/2021    Latest Reference Range & Units 12/04/21 12:12  Sodium 135 - 145 mEq/L 135  Potassium 3.5 - 5.1 mEq/L 4.0  Chloride 96 - 112 mEq/L 101  CO2 19 - 32 mEq/L 26  Glucose 70 - 99 mg/dL 161 (H)  BUN 6 - 23 mg/dL 8  Creatinine 0.96 - 0.45 mg/dL 4.09  Calcium 8.4 - 81.1 mg/dL 9.2  GFR >91.47 mL/min 85.33  Total CHOL/HDL Ratio  4  Cholesterol 0 - 200 mg/dL 829 (H)  HDL Cholesterol >39.00 mg/dL 56.21  LDL (calc) 0 - 99 mg/dL 308 (H)  MICROALB/CREAT RATIO 0.0 - 30.0 mg/g 0.6  NonHDL  167.32  Triglycerides 0.0 - 149.0 mg/dL 65.7  VLDL 0.0 - 84.6 mg/dL 96.2     Latest Reference Range & Units 12/04/21 12:12   TSH 0.35 -  5.50 uIU/mL 54.99 (H)    Latest Reference Range & Units 12/04/21 12:12  Creatinine,U mg/dL 295.2  Microalb, Ur 0.0 - 1.9 mg/dL <8.4  MICROALB/CREAT RATIO 0.0 - 30.0 mg/g 0.6      ASSESSMENT / PLAN / RECOMMENDATIONS:   1) Type 1 Diabetes Mellitus, Poorly controlled, Without complications - Most recent A1c of 8.2%. Goal A1c < 7.0 %.     -A1c continues to be above goal -We are again unable to download his OmniPod, he has not contacted OmniPod, a message has been sent to see our CDE to see if she can help him with this, patient also advised to contact OmniPod -I will upgrade his OmniPod to the Dexcom G7 -Weight and discussed the importance of bolusing with each meal, in the past I had advised him to enter a set dose of grams per meal but he has not been consistently doing this.  I again encouraged him to enter number 5 g with each meal, as historically he has been dependent on his basal insulin, hence glycemic excursions -He has been in the manual mode, because in the past it causes more fluctuations, but today he agreed to switch to the automatic mode -I have reduced his basal rate -He was intolerant to Trulicity 4.5 mg weekly, we attempted to prescribe Ozempic but this was not covered by his insurance as he is a type I DM. -Will try prescribing Rybelsus, caution against GI side effects, he was provided with #30 sample of 3 mg Rybelsus then will increase to 7 mg after a month -Emphasized the importance of taking Rybelsus (which he may take with his PPI and levothyroxine) , half an hour before breakfast  MEDICATIONS: Start Rybelsus 3 mg, 1 tablet before breakfast for 30 days then increase to 7 mg daily Humalog  Pump   Omnipod 5 Settings   Insulin type   Humalog    Basal rate       0000 2.70 u/h    0400 2.50   1200 4.10   2000 3.35  I:C ratio       0000 1:1 Enter #5 g TIDQAC          Sensitivity       0000  30      Goal       0000  110           EDUCATION /  INSTRUCTIONS: BG monitoring instructions: Patient is instructed to check his blood sugars 3 times a day, before meals. Call [facility] if: BG persistently < 70  I reviewed the Rule of 15 for the treatment of hypoglycemia in detail with the patient. Literature supplied.    2) Diabetic complications:  Eye: Does not have known diabetic retinopathy.  Neuro/ Feet: Does not have known diabetic peripheral neuropathy .  Renal: Patient does not have known baseline CKD. He   is not on an ACEI/ARB at present.    3) Hypothyroidism:  -He has been taking levothyroxine with a multivitamin -Patient advised to separate vitamins by 4 hours from levothyroxine -We will recheck on the next visit    Medication Levothyroxine 200 mcg daily Levothyroxine 25 mcg daily    4)Dyslipidemia:  -LDL has been elevated in the past  -No indication for statin therapy will continue to monitor and encourage low-fat diet   5)GERD:  - Patient on omeprazole 20 mg, would like to increase  Medication  Increase omeprazole 40 mg daily     F/U in  3 months     Signed electronically by: Lyndle Herrlich, MD  [facility] Endocrinology  [facility] [address] [place], [place] [address] Phone: [phone] FAX: [phone]   CC: Patient, No Pcp Per No address on file Phone: None  Fax: None  Return to Endocrinology clinic as below: No future appointments.

## 2022-10-22 ENCOUNTER — Ambulatory Visit: Payer: Managed Care, Other (non HMO) | Admitting: Internal Medicine

## 2022-10-22 ENCOUNTER — Telehealth: Payer: Self-pay | Admitting: Internal Medicine

## 2022-10-22 ENCOUNTER — Encounter: Payer: Self-pay | Admitting: Internal Medicine

## 2022-10-22 VITALS — BP 120/80 | HR 76 | Ht 74.0 in | Wt 280.0 lb

## 2022-10-22 DIAGNOSIS — E1065 Type 1 diabetes mellitus with hyperglycemia: Secondary | ICD-10-CM

## 2022-10-22 DIAGNOSIS — E063 Autoimmune thyroiditis: Secondary | ICD-10-CM | POA: Diagnosis not present

## 2022-10-22 LAB — POCT GLYCOSYLATED HEMOGLOBIN (HGB A1C): Hemoglobin A1C: 8.2 % — AB (ref 4.0–5.6)

## 2022-10-22 MED ORDER — LEVOTHYROXINE SODIUM 25 MCG PO TABS: 25.0000 ug | ORAL_TABLET | Freq: Every day | ORAL | 2 refills | Status: DC

## 2022-10-22 MED ORDER — OMNIPOD 5 G7 PODS (GEN 5) MISC: 1.0000 | 3 refills | Status: DC

## 2022-10-22 MED ORDER — DEXCOM G7 SENSOR MISC: 1.0000 | 3 refills | Status: DC

## 2022-10-22 MED ORDER — INSULIN LISPRO 100 UNIT/ML IJ SOLN: INTRAMUSCULAR | 3 refills | Status: DC

## 2022-10-22 MED ORDER — RYBELSUS 7 MG PO TABS
7.0000 mg | ORAL_TABLET | Freq: Every day | ORAL | 3 refills | Status: DC
Start: 2022-10-22 — End: 2023-03-26

## 2022-10-22 MED ORDER — LEVOTHYROXINE SODIUM 200 MCG PO TABS: 200.0000 ug | ORAL_TABLET | Freq: Every day | ORAL | 0 refills | Status: DC

## 2022-10-22 MED ORDER — OMEPRAZOLE 40 MG PO CPDR: 40.0000 mg | DELAYED_RELEASE_CAPSULE | Freq: Every day | ORAL | 3 refills | Status: DC

## 2022-10-22 MED ORDER — RYBELSUS 3 MG PO TABS: 3.0000 mg | ORAL_TABLET | Freq: Every day | ORAL | Status: DC

## 2022-10-22 NOTE — Patient Instructions (Signed)
Start Rybelsus 3 mg, 1 tablet half an hour before breakfast for a month, then increase to 7 mg daily   Please enter 5 g with each meal from now on     HOW TO TREAT LOW BLOOD SUGARS (Blood sugar LESS THAN 70 MG/DL) Please follow the RULE OF 15 for the treatment of hypoglycemia treatment (when your (blood sugars are less than 70 mg/dL)   STEP 1: Take 15 grams of carbohydrates when your blood sugar is low, which includes:  3-4 GLUCOSE TABS  OR 3-4 OZ OF JUICE OR REGULAR SODA OR ONE TUBE OF GLUCOSE GEL    STEP 2: RECHECK blood sugar in 15 MINUTES STEP 3: If your blood sugar is still low at the 15 minute recheck --> then, go back to STEP 1 and treat AGAIN with another 15 grams of carbohydrates.

## 2022-10-22 NOTE — Telephone Encounter (Signed)
Bonita Quin,   Can you please help this pt be able to connect his Omnipod 5 ?   We have not been able to download it for a while    Thanks

## 2022-10-23 ENCOUNTER — Encounter: Payer: Self-pay | Admitting: Internal Medicine

## 2022-10-26 ENCOUNTER — Telehealth: Payer: Self-pay

## 2022-10-26 ENCOUNTER — Other Ambulatory Visit (HOSPITAL_COMMUNITY): Payer: Self-pay

## 2022-10-26 NOTE — Telephone Encounter (Signed)
Pharmacy Patient Advocate Encounter   Received notification from CoverMyMeds that prior authorization for Rybelsus is required/requested.   Insurance verification completed.   The patient is insured through [employer] .   Per test claim: PA required; PA submitted to [employer] via CoverMyMeds Key/confirmation #/EOC [id number] Status is pending

## 2022-10-27 NOTE — Telephone Encounter (Signed)
LVM to call me to let me know his OmniPod user name and password to allow [place] to link him to the cloud based data of his insulin pump, so that we can view all of his pump data.

## 2022-10-28 NOTE — Telephone Encounter (Signed)
Pharmacy Patient Advocate Encounter  Received notification from [employer] that Prior Authorization for Rybelsus has been DENIED. Please advise how you'd like to proceed. Full denial letter will be uploaded to the media tab. See denial reason below.

## 2022-10-29 ENCOUNTER — Telehealth: Payer: Self-pay

## 2022-10-29 NOTE — Telephone Encounter (Signed)
David [name] has left a voicemail stating he  is returning a call to [place] to schedule a time to come in to have his [employer] up , please advise

## 2022-11-04 ENCOUNTER — Other Ambulatory Visit: Payer: Self-pay | Admitting: Internal Medicine

## 2022-11-04 DIAGNOSIS — E669 Obesity, unspecified: Secondary | ICD-10-CM

## 2022-11-04 NOTE — Telephone Encounter (Signed)
Referral placed.

## 2022-11-24 ENCOUNTER — Telehealth: Payer: Self-pay | Admitting: Internal Medicine

## 2022-11-24 NOTE — Telephone Encounter (Signed)
I spoke with [name], and he has given me his user name and password to OmniPod.  He is now linked to [facility] and says he has been wearing the Omnipod 5 for "quite some time" and does not need training.

## 2022-11-24 NOTE — Telephone Encounter (Signed)
Can you please download his OMNIPOD ?   Thanks

## 2023-01-27 ENCOUNTER — Telehealth: Payer: Self-pay

## 2023-01-27 NOTE — Telephone Encounter (Signed)
Patient states that he is unable to get his Dexcom to connect to his Omnipod and needs help.

## 2023-02-02 ENCOUNTER — Telehealth: Payer: Self-pay | Admitting: Nutrition

## 2023-02-02 NOTE — Telephone Encounter (Signed)
LVM to call the helpline of Omnipod as well as Dexcom.  Gave him both of these numbers

## 2023-02-02 NOTE — Telephone Encounter (Signed)
LVM of help line telephone number to OmniPod and Dexcom.  Also told him to call me if he still needs assistance.

## 2023-03-06 ENCOUNTER — Other Ambulatory Visit: Payer: Self-pay | Admitting: Internal Medicine

## 2023-03-24 NOTE — Telephone Encounter (Signed)
 Opened in error

## 2023-03-26 ENCOUNTER — Ambulatory Visit: Payer: Managed Care, Other (non HMO) | Admitting: Internal Medicine

## 2023-03-26 ENCOUNTER — Telehealth: Payer: Self-pay | Admitting: Internal Medicine

## 2023-03-26 ENCOUNTER — Encounter: Payer: Self-pay | Admitting: Internal Medicine

## 2023-03-26 VITALS — BP 126/84 | HR 76 | Ht 74.0 in | Wt 279.0 lb

## 2023-03-26 DIAGNOSIS — E1065 Type 1 diabetes mellitus with hyperglycemia: Secondary | ICD-10-CM

## 2023-03-26 DIAGNOSIS — E063 Autoimmune thyroiditis: Secondary | ICD-10-CM | POA: Diagnosis not present

## 2023-03-26 DIAGNOSIS — Z6835 Body mass index (BMI) 35.0-35.9, adult: Secondary | ICD-10-CM | POA: Diagnosis not present

## 2023-03-26 LAB — POCT GLYCOSYLATED HEMOGLOBIN (HGB A1C): Hemoglobin A1C: 8 % — AB (ref 4.0–5.6)

## 2023-03-26 NOTE — Progress Notes (Unsigned)
Name: David [name]  Age/ Sex: 28 y.o., male   MRN/ DOB: [id number], [birth year]     PCP: Patient, No Pcp Per   Reason for Endocrinology Evaluation: Type 1 Diabetes Mellitus  Initial Endocrine Consultative Visit: 07/06/2016    PATIENT IDENTIFIER: David [name] is a 28 y.o. male with a past medical history of TDM. The patient has followed with Endocrinology clinic since 07/06/2016 for consultative assistance with management of his diabetes.     DIABETIC HISTORY:  David [name] was diagnosed with DM 2007. His hemoglobin A1c has ranged from 7.7% in 2022, peaking at 13.0% in 2014.  Developed side effects to Trulicity due to GI effects     THYROID HISTORY: Has been on LT-4 replacement since middle school .  He was followed by Dr. Everardo All from 2018 until 2023   On his initial visit with me he had an A1c of 8.3% while on pump technology, he was started on Ozempic   SUBJECTIVE:   During the last visit (10/22/2022): A1c 8.2%    Today (03/26/2023): David [name] is here for a follow up on diabetes management.  He checks his blood sugars multiple  times daily. The patient has  had hypoglycemic episodes since the last clinic visit, which typically occur at variable  times .   Patient on PPI for GERD symptoms, is asking for higher dose He has been more compliant with levothyroxine Has not been able to contact OmniPod   Pump and meter download:   Pump   Omnipod 5 Settings   Insulin type   Humalog    Basal rate       0000 2.70 u/h    0400 2.50   1200 4.10   2000 3.40  I:C ratio       0000 1:1 Enter #5 g TIDQAC          Sensitivity       0000  30      Goal       0000  110              Type & Model of Pump: Omnipod  Insulin Type: Currently using Humalog .  Body mass index is 35.82 kg/m.   PUMP STATISTICS: Average BG: n/a Average Daily Carbs (g): 8  Average Total Daily Insulin: 82.9  Average Daily Basal: 70.6 (85 %) Average Daily Bolus: 12.3  (15 %)    HOME DIABETES REGIMEN:  Levothyroxine 200 mcg daily  Levothyroxine 25 mcg daily  Humalog     Statin: no ACE-I/ARB: no    CONTINUOUS GLUCOSE MONITORING RECORD INTERPRETATION    Dates of Recording: 1/4-1/17/2025  Sensor description:dexcom  Results statistics:   CGM use % of time 94  Average and SD 221/76  Time in range 31%  % Time Above 180 31  % Time above 250 37  % Time Below target 1   Glycemic patterns summary: BGs are high throughout the day and night  Hypoglycemic episodes occurred at variable times but mostly in the morning  Overnight periods: Trends down     DIABETIC COMPLICATIONS: Microvascular complications:   Denies: CKD Last Eye Exam: Completed 2023  Macrovascular complications:   Denies: CAD, CVA, PVD   HISTORY:  Past Medical History:  Past Medical History:  Diagnosis Date   Diabetes mellitus    Goiter    Gynecomastia    Hypoglycemia associated with diabetes (HCC)    Hypothyroidism, acquired, autoimmune    Thyroiditis, autoimmune  Past Surgical History: No past surgical history on file. Social History:  reports that he has never smoked. He has never used smokeless tobacco. He reports that he does not drink alcohol and does not use drugs. Family History:  Family History  Problem Relation Age of Onset   Diabetes Mother    Hypothyroidism Mother    Cancer Mother    Hypertension Mother    Cancer Brother    Hyperthyroidism Paternal Grandfather      HOME MEDICATIONS: Allergies as of 03/26/2023   No Known Allergies      Medication List        Accurate as of March 26, 2023  9:37 AM. If you have any questions, ask your nurse or doctor.          STOP taking these medications    Rybelsus 3 MG Tabs Generic drug: Semaglutide Stopped by: Johnney Ou Casten Floren   Rybelsus 7 MG Tabs Generic drug: Semaglutide Stopped by: Johnney Ou Retia Cordle       TAKE these medications    Dexcom G7 Sensor Misc 1 Device by  Does not apply route as directed.   glucagon 1 MG injection Inject 1mg  intramuscular as needed for severe Hypoglecemia. Dispense 4= 30 days   insulin lispro 100 UNIT/ML injection Commonly known as: HumaLOG Max daily 120 units   levothyroxine 25 MCG tablet Commonly known as: SYNTHROID Take 1 tablet (25 mcg total) by mouth daily. To be taking in addition to levothyroxine 200 mcg daily   levothyroxine 200 MCG tablet Commonly known as: SYNTHROID TAKE 1 TABLET (200 MCG TOTAL) BY MOUTH DAILY BEFORE BREAKFAST.   omeprazole 40 MG capsule Commonly known as: PRILOSEC Take 1 capsule (40 mg total) by mouth daily.   Omnipod 5 G7 Pods (Gen 5) Misc 1 Device by Does not apply route every other day.         OBJECTIVE:   Vital Signs: BP 126/84 (BP Location: Left Arm, Patient Position: Sitting, Cuff Size: Normal)   Pulse 76   Ht 6\' 2"  (1.88 m)   Wt 279 lb (126.6 kg)   SpO2 99%   BMI 35.82 kg/m   Wt Readings from Last 3 Encounters:  03/26/23 279 lb (126.6 kg)  10/22/22 280 lb (127 kg)  04/23/22 283 lb (128.4 kg)     Exam: General: Pt appears well and is in NAD  Lungs: Clear with good BS bilat   Heart: RRR   Abdomen:  soft, nontender  Extremities: Trace pretibial edema.   Neuro: MS is good with appropriate affect, pt is alert and Ox3      DM Foot Exam 03/26/2023  The skin of the feet is intact without sores or ulcerations. The pedal pulses are 2+ on right and 2+ on left. The sensation is intact to a screening 5.07, 10 gram monofilament bilaterally     DATA REVIEWED:  Lab Results  Component Value Date   HGBA1C 8.2 (A) 10/22/2022   HGBA1C 7.9 (A) 04/23/2022   HGBA1C 8.3 (A) 12/04/2021   ****  ASSESSMENT / PLAN / RECOMMENDATIONS:   1) Type 1 Diabetes Mellitus, Poorly controlled, Without complications - Most recent A1c of 8.0%. Goal A1c < 7.0 %.     -A1c continues to be above goal -He has been doing better at entering carbohydrates with each meal, he does not  count his carbohydrates, has he is on a set dose, I have increased his grams per meal and snack as below -He was also able to  try the OmniPod in the automatic mode, but he has switched to using the iPhone OmniPod pad and is unable to upgrade to the G7.  A message will be sent to the OmniPod rep regarding this to see if they can help  -He was intolerant to Trulicity 4.5 mg , as well as Ozempic. -I will decrease his basal rate due to hypoglycemia overnight -I will also adjust his sensitivity factor as below  MEDICATIONS:   Pump   Omnipod 5 Settings   Insulin type   Humalog    Basal rate       0000 2.60 u/h    0400 2.40   1200 4.10   2000 3.35  I:C ratio       0000 1:1 Enter #6 g with a meal, 3 g with a snack           Sensitivity       0000  35      Goal       0000  110           EDUCATION / INSTRUCTIONS: BG monitoring instructions: Patient is instructed to check his blood sugars 3 times a day, before meals. Call [facility] if: BG persistently < 70  I reviewed the Rule of 15 for the treatment of hypoglycemia in detail with the patient. Literature supplied.    2) Diabetic complications:  Eye: Does not have known diabetic retinopathy.  Neuro/ Feet: Does not have known diabetic peripheral neuropathy .  Renal: Patient does not have known baseline CKD. He   is not on an ACEI/ARB at present.    3) Hypothyroidism:  -Patient is clinically euthyroid   Medication Levothyroxine 200 mcg daily Levothyroxine 25 mcg daily    4)Dyslipidemia:  -LDL has been elevated in the past  -No indication for statin therapy will continue to monitor and encourage low-fat diet    5)Obesity:  -Intolerant to Trulicity and Ozempic -Referral to  healthy weight and management clinic was placed   F/U in 3 months     Signed electronically by: Lyndle Herrlich, MD  [facility] Endocrinology  [facility] [address] [place] [place], [place] [address] Phone: [phone] FAX: [phone]   CC: Patient, No Pcp Per No address on file Phone: None  Fax: None  Return to Endocrinology clinic as below: No future appointments.

## 2023-03-26 NOTE — Telephone Encounter (Signed)
Can you please contact Ronaldo Miyamoto with [employer] rep and give him Mr. Dockendorf's information as the patient has not been able to link Dexcom G7 to the iPhone OmniPod app   Not sure if they have not updated it yet   Thanks

## 2023-03-26 NOTE — Patient Instructions (Signed)
   Please enter 6 g with each meal and 3 g with a snack    HOW TO TREAT LOW BLOOD SUGARS (Blood sugar LESS THAN 70 MG/DL) Please follow the RULE OF 15 for the treatment of hypoglycemia treatment (when your (blood sugars are less than 70 mg/dL)   STEP 1: Take 15 grams of carbohydrates when your blood sugar is low, which includes:  3-4 GLUCOSE TABS  OR 3-4 OZ OF JUICE OR REGULAR SODA OR ONE TUBE OF GLUCOSE GEL    STEP 2: RECHECK blood sugar in 15 MINUTES STEP 3: If your blood sugar is still low at the 15 minute recheck --> then, go back to STEP 1 and treat AGAIN with another 15 grams of carbohydrates.

## 2023-03-26 NOTE — Telephone Encounter (Signed)
I have sent Ronaldo Miyamoto the information to follow up with patient

## 2023-03-27 LAB — MICROALBUMIN / CREATININE URINE RATIO
Creatinine, Urine: 167 mg/dL (ref 20–320)
Microalb Creat Ratio: 2 mg/g{creat} (ref <30)
Microalb, Ur: 0.4 mg/dL

## 2023-03-27 LAB — COMPREHENSIVE METABOLIC PANEL
AG Ratio: 1.7 (calc) (ref 1.0–2.5)
ALT: 14 U/L (ref 9–46)
AST: 16 U/L (ref 10–40)
Albumin: 4.4 g/dL (ref 3.6–5.1)
Alkaline phosphatase (APISO): 57 U/L (ref 36–130)
BUN: 12 mg/dL (ref 7–25)
CO2: 29 mmol/L (ref 20–32)
Calcium: 9.6 mg/dL (ref 8.6–10.3)
Chloride: 102 mmol/L (ref 98–110)
Creat: 0.91 mg/dL (ref 0.60–1.24)
Globulin: 2.6 g/dL (ref 1.9–3.7)
Glucose, Bld: 103 mg/dL — ABNORMAL HIGH (ref 65–99)
Potassium: 4.1 mmol/L (ref 3.5–5.3)
Sodium: 139 mmol/L (ref 135–146)
Total Bilirubin: 1.2 mg/dL (ref 0.2–1.2)
Total Protein: 7 g/dL (ref 6.1–8.1)

## 2023-03-27 LAB — LIPID PANEL
Cholesterol: 205 mg/dL — ABNORMAL HIGH (ref <200)
HDL: 53 mg/dL (ref >=40)
LDL Cholesterol (Calc): 137 mg/dL — ABNORMAL HIGH
Non-HDL Cholesterol (Calc): 152 mg/dL — ABNORMAL HIGH (ref <130)
Total CHOL/HDL Ratio: 3.9 (calc) (ref <5.0)
Triglycerides: 55 mg/dL (ref <150)

## 2023-03-27 LAB — VITAMIN B12: Vitamin B-12: 652 pg/mL (ref 200–1100)

## 2023-03-27 LAB — TSH: TSH: 43 m[IU]/L — ABNORMAL HIGH (ref 0.40–4.50)

## 2023-03-27 LAB — VITAMIN D 25 HYDROXY (VIT D DEFICIENCY, FRACTURES): Vit D, 25-Hydroxy: 28 ng/mL — ABNORMAL LOW (ref 30–100)

## 2023-03-29 ENCOUNTER — Encounter: Payer: Self-pay | Admitting: Internal Medicine

## 2023-03-29 MED ORDER — LEVOTHYROXINE SODIUM 150 MCG PO TABS: 300.0000 ug | ORAL_TABLET | Freq: Every day | ORAL | 3 refills | Status: DC

## 2023-05-12 ENCOUNTER — Encounter (INDEPENDENT_AMBULATORY_CARE_PROVIDER_SITE_OTHER): Payer: Self-pay

## 2023-07-06 ENCOUNTER — Encounter: Payer: Self-pay | Admitting: Internal Medicine

## 2023-07-06 ENCOUNTER — Ambulatory Visit: Payer: Managed Care, Other (non HMO) | Admitting: Internal Medicine

## 2023-07-06 ENCOUNTER — Telehealth: Payer: Self-pay

## 2023-07-06 VITALS — BP 126/86 | HR 75 | Ht 74.0 in | Wt 273.0 lb

## 2023-07-06 DIAGNOSIS — E1065 Type 1 diabetes mellitus with hyperglycemia: Secondary | ICD-10-CM

## 2023-07-06 DIAGNOSIS — E063 Autoimmune thyroiditis: Secondary | ICD-10-CM

## 2023-07-06 LAB — POCT GLYCOSYLATED HEMOGLOBIN (HGB A1C): Hemoglobin A1C: 7.5 % — AB (ref 4.0–5.6)

## 2023-07-06 MED ORDER — LEVOTHYROXINE SODIUM 150 MCG PO TABS: 300.0000 ug | ORAL_TABLET | Freq: Every day | ORAL | 3 refills | Status: DC

## 2023-07-06 NOTE — Telephone Encounter (Signed)
 Left vm that paperwork os ready for pick up. It has been placed up front in the folder

## 2023-07-06 NOTE — Progress Notes (Signed)
 Name: David [name]  Age/ Sex: 28 y.o., male   MRN/ DOB: [id number], [birth year]     PCP: Patient, No Pcp Per   Reason for Endocrinology Evaluation: Type 1 Diabetes Mellitus  Initial Endocrine Consultative Visit: 07/06/2016    PATIENT IDENTIFIER: David [name] is a 28 y.o. male with a past medical history of TDM. The patient has followed with Endocrinology clinic since 07/06/2016 for consultative assistance with management of his diabetes.     DIABETIC HISTORY:  Mr. Edholm was diagnosed with DM 2007. His hemoglobin A1c has ranged from 7.7% in 2022, peaking at 13.0% in 2014.  Developed side effects to Trulicity  due to GI effects     THYROID  HISTORY: Has been on LT-4 replacement since middle school .  He was followed by Dr. Washington Hacker from 2018 until 2023   On his initial visit with me he had an A1c of 8.3% while on pump technology, he was started on Ozempic    SUBJECTIVE:   During the last visit (03/26/2023): A1c 8.0%    Today (07/06/2023): Mr. [name] is here for a follow up on diabetes management.  He checks his blood sugars multiple  times daily. The patient has had hypoglycemic episodes since the last clinic visit.  He has been sick with URI and allergies  Pt has been noted with weight loss  No nausea or vomiting  No constipation or diarrhea     Pump and meter download:   Pump   Omnipod 5 Settings   Insulin  type   Humalog     Basal rate       0000 2.60 u/h    0400 2.40   1200 4.10   2000 3.4  I:C ratio       0000 1:1 Enter #6 g TIDQAC, 3g with snacks          Sensitivity       0000  35      Goal       0000  110              Type & Model of Pump: Omnipod  Insulin  Type: Currently using Humalog  .  Body mass index is 35.05 kg/m.   PUMP STATISTICS: Average BG: n/a Average Daily Carbs (g): 9.6 Average Total Daily Insulin : 84.5 Average Daily Basal: 86 (72.7 %) Average Daily Bolus: 11.8 (14 %)   HOME DIABETES REGIMEN:   Levothyroxine  150 mcg, 2 tabs  daily  Vitamin D  1000 international unit  daily  Humalog     Statin: no ACE-I/ARB: no    CONTINUOUS GLUCOSE MONITORING RECORD INTERPRETATION    Dates of Recording: 4/16-4/29/2025  Sensor description:dexcom  Results statistics:   CGM use % of time 85  Average and SD 185/79  Time in range 46%  % Time Above 180 27  % Time above 250 22  % Time Below target 5   Glycemic patterns summary: Patient has been noted with hypoglycemia overnight and fluctuating BG's throughout the day  Hypoglycemic episodes occurred overnight  Overnight periods: Trends down     DIABETIC COMPLICATIONS: Microvascular complications:   Denies: CKD Last Eye Exam: Completed 2023  Macrovascular complications:   Denies: CAD, CVA, PVD   HISTORY:  Past Medical History:  Past Medical History:  Diagnosis Date   Diabetes mellitus    Goiter    Gynecomastia    Hypoglycemia associated with diabetes (HCC)    Hypothyroidism, acquired, autoimmune    Thyroiditis, autoimmune    Past Surgical History: No  past surgical history on file. Social History:  reports that he has never smoked. He has never used smokeless tobacco. He reports that he does not drink alcohol and does not use drugs. Family History:  Family History  Problem Relation Age of Onset   Diabetes Mother    Hypothyroidism Mother    Cancer Mother    Hypertension Mother    Cancer Brother    Hyperthyroidism Paternal Grandfather      HOME MEDICATIONS: Allergies as of 07/06/2023   No Known Allergies      Medication List        Accurate as of July 06, 2023 10:01 AM. If you have any questions, ask your nurse or doctor.          amoxicillin-clavulanate 875-125 MG tablet Commonly known as: AUGMENTIN Take 1 tablet by mouth.   Dexcom G7 Sensor Misc 1 Device by Does not apply route as directed.   glucagon  1 MG injection Inject 1mg  intramuscular as needed for severe Hypoglecemia. Dispense 4= 30  days   insulin  lispro 100 UNIT/ML injection Commonly known as: HumaLOG  Max daily 120 units   levothyroxine  150 MCG tablet Commonly known as: SYNTHROID  Take 2 tablets (300 mcg total) by mouth daily.   omeprazole  40 MG capsule Commonly known as: PRILOSEC Take 1 capsule (40 mg total) by mouth daily.   Omnipod 5 G7 Pods (Gen 5) Misc 1 Device by Does not apply route every other day.         OBJECTIVE:   Vital Signs: BP 126/86 (BP Location: Left Arm, Patient Position: Sitting, Cuff Size: Normal)   Pulse 75   Ht 6\' 2"  (1.88 m)   Wt 273 lb (123.8 kg)   SpO2 98%   BMI 35.05 kg/m   Wt Readings from Last 3 Encounters:  07/06/23 273 lb (123.8 kg)  03/26/23 279 lb (126.6 kg)  10/22/22 280 lb (127 kg)     Exam: General: Pt appears well and is in NAD  Lungs: Clear with good BS bilat   Heart: RRR   Abdomen:  soft, nontender  Extremities: Trace pretibial edema.   Neuro: MS is good with appropriate affect, pt is alert and Ox3      DM Foot Exam 03/26/2023  The skin of the feet is intact without sores or ulcerations. The pedal pulses are 2+ on right and 2+ on left. The sensation is intact to a screening 5.07, 10 gram monofilament bilaterally     DATA REVIEWED:  Lab Results  Component Value Date   HGBA1C 7.5 (A) 07/06/2023   HGBA1C 8.0 (A) 03/26/2023   HGBA1C 8.2 (A) 10/22/2022    Latest Reference Range & Units 03/26/23 10:07  COMPREHENSIVE METABOLIC PANEL  Rpt !  Sodium 135 - 146 mmol/L 139  Potassium 3.5 - 5.3 mmol/L 4.1  Chloride 98 - 110 mmol/L 102  CO2 20 - 32 mmol/L 29  Glucose 65 - 99 mg/dL 161 (H)  BUN 7 - 25 mg/dL 12  Creatinine 0.96 - 0.45 mg/dL 4.09  Calcium 8.6 - 81.1 mg/dL 9.6  BUN/Creatinine Ratio 6 - 22 (calc) SEE NOTE:  AG Ratio 1.0 - 2.5 (calc) 1.7  AST 10 - 40 U/L 16  ALT 9 - 46 U/L 14  Total Protein 6.1 - 8.1 g/dL 7.0  Total Bilirubin 0.2 - 1.2 mg/dL 1.2  Total CHOL/HDL Ratio <5.0 (calc) 3.9  Cholesterol <200 mg/dL 914 (H)  HDL  Cholesterol > OR = 40 mg/dL 53  LDL Cholesterol (Calc)  mg/dL (calc) 161 (H)  MICROALB/CREAT RATIO <30 mg/g creat 2  Non-HDL Cholesterol (Calc) <130 mg/dL (calc) 096 (H)  Triglycerides <150 mg/dL 55  Alkaline phosphatase (APISO) 36 - 130 U/L 57  Vitamin D , 25-Hydroxy 30 - 100 ng/mL 28 (L)  Vitamin B12 200 - 1,100 pg/mL 652  Globulin 1.9 - 3.7 g/dL (calc) 2.6    Latest Reference Range & Units 03/26/23 10:07  TSH 0.40 - 4.50 mIU/L 43.00 (H)  Albumin MSPROF 3.6 - 5.1 g/dL 4.4  Microalb, Ur mg/dL 0.4  MICROALB/CREAT RATIO <30 mg/g creat 2  Creatinine, Urine 20 - 320 mg/dL 045    ASSESSMENT / PLAN / RECOMMENDATIONS:   1) Type 1 Diabetes Mellitus, Sub-optimally  controlled, Without complications - Most recent A1c of 7.5%. Goal A1c < 7.0 %.     -A1c continues to trend down -He has been doing better at entering carbohydrates with each meal -He has been noted with insulin  stacking, we did discuss this today -He was also able to try the OmniPod in the automatic mode, but he has switched to using the iPhone OmniPod pad and is unable to upgrade to the G7 yet.  Patient advised to continue to enter glucose reading at the same time he enters his grams, that way he can receive correction -He was intolerant to Trulicity  4.5 mg , as well as Ozempic . -I will decrease his basal rate due to hypoglycemia overnight -DMV form filled     MEDICATIONS:   Pump   Omnipod 5 Settings   Insulin  type   Humalog     Basal rate       0000 2.55 u/h    0400 2.35   1200 4.10   2000 3.35  I:C ratio       0000 1:1 Enter #6 g with a meal, 3 g with a snack           Sensitivity       0000  35      Goal       0000  110           EDUCATION / INSTRUCTIONS: BG monitoring instructions: Patient is instructed to check his blood sugars 3 times a day, before meals. Call [facility] if: BG persistently < 70  I reviewed the Rule of 15 for the treatment of hypoglycemia in detail with the  patient. Literature supplied.    2) Diabetic complications:  Eye: Does not have known diabetic retinopathy.  Neuro/ Feet: Does not have known diabetic peripheral neuropathy .  Renal: Patient does not have known baseline CKD. He   is not on an ACEI/ARB at present.    3) Hypothyroidism:  -Patient does notice tiring out easily -Unfortunately he has not changed levothyroxine  dose as previously advised -I will not check his TFTs today as he continues to use the old levothyroxine  prescription -Will recheck in 2 months    Medication levothyroxine  150 mcg, 2 tablets daily   4)Dyslipidemia:  -LDL continues to be elevated but is trending down -No indication for statin therapy will continue to monitor and encourage low-fat diet   4) Vitamin D  insufficiency:  -Slightly low - Will check in 2 months  Medication Continue over-the-counter vitamin D3 1000 IU daily     F/U in 4 months     Signed electronically by: Natale Bail, MD  [facility] Endocrinology  [facility] [address] [place] [place], [place] [address] Phone: [phone] FAX: [phone]  CC: Patient, No Pcp Per No address on file Phone: None  Fax: None  Return to Endocrinology clinic as below: No future appointments.

## 2023-07-06 NOTE — Patient Instructions (Signed)
   Please enter 6 g with each meal and 3 g with a snack    HOW TO TREAT LOW BLOOD SUGARS (Blood sugar LESS THAN 70 MG/DL) Please follow the RULE OF 15 for the treatment of hypoglycemia treatment (when your (blood sugars are less than 70 mg/dL)   STEP 1: Take 15 grams of carbohydrates when your blood sugar is low, which includes:  3-4 GLUCOSE TABS  OR 3-4 OZ OF JUICE OR REGULAR SODA OR ONE TUBE OF GLUCOSE GEL    STEP 2: RECHECK blood sugar in 15 MINUTES STEP 3: If your blood sugar is still low at the 15 minute recheck --> then, go back to STEP 1 and treat AGAIN with another 15 grams of carbohydrates.

## 2023-07-07 ENCOUNTER — Encounter: Payer: Self-pay | Admitting: Internal Medicine

## 2023-07-07 NOTE — Telephone Encounter (Signed)
 Patient came in to office today and picked up the paper work that Dr. Rosalea Collin completed for him.

## 2023-09-03 ENCOUNTER — Other Ambulatory Visit

## 2023-11-05 ENCOUNTER — Encounter: Payer: Self-pay | Admitting: Internal Medicine

## 2023-11-05 ENCOUNTER — Ambulatory Visit: Admitting: Internal Medicine

## 2023-11-05 VITALS — BP 124/80 | HR 76 | Ht 74.0 in | Wt 275.0 lb

## 2023-11-05 DIAGNOSIS — E063 Autoimmune thyroiditis: Secondary | ICD-10-CM

## 2023-11-05 DIAGNOSIS — E1065 Type 1 diabetes mellitus with hyperglycemia: Secondary | ICD-10-CM | POA: Diagnosis not present

## 2023-11-05 LAB — POCT GLYCOSYLATED HEMOGLOBIN (HGB A1C): Hemoglobin A1C: 7.6 % — AB (ref 4.0–5.6)

## 2023-11-05 NOTE — Patient Instructions (Addendum)
   Please enter 8 g with each meal and 4 g with a snack    HOW TO TREAT LOW BLOOD SUGARS (Blood sugar LESS THAN 70 MG/DL) Please follow the RULE OF 15 for the treatment of hypoglycemia treatment (when your (blood sugars are less than 70 mg/dL)   STEP 1: Take 15 grams of carbohydrates when your blood sugar is low, which includes:  3-4 GLUCOSE TABS  OR 3-4 OZ OF JUICE OR REGULAR SODA OR ONE TUBE OF GLUCOSE GEL    STEP 2: RECHECK blood sugar in 15 MINUTES STEP 3: If your blood sugar is still low at the 15 minute recheck --> then, go back to STEP 1 and treat AGAIN with another 15 grams of carbohydrates.

## 2023-11-05 NOTE — Progress Notes (Signed)
 Name: David [name]  Age/ Sex: 28 y.o., male   MRN/ DOB: [id number], 04-09-1995     PCP: Patient, No Pcp Per   Reason for Endocrinology Evaluation: Type 1 Diabetes Mellitus  Initial Endocrine Consultative Visit: 07/06/2016    PATIENT IDENTIFIER: David [name] is a 28 y.o. male with a past medical history of TDM. The patient has followed with Endocrinology clinic since 07/06/2016 for consultative assistance with management of his diabetes.     DIABETIC HISTORY:  David [name] was diagnosed with DM 2007. His hemoglobin A1c has ranged from 7.7% in 2022, peaking at 13.0% in 2014.  Developed side effects to Trulicity  due to GI effects     THYROID  HISTORY: Has been on LT-4 replacement since middle school .  He was followed by Dr. Kassie from 2018 until 2023   On his initial visit with me he had an A1c of 8.3% while on pump technology, he was started on Ozempic    SUBJECTIVE:   During the last visit (07/06/2023): A1c 7.5%    Today (11/05/2023): David [name] is here for a follow up on diabetes management.  He checks his blood sugars multiple  times daily. The patient has had hypoglycemic episodes since the last clinic visit.   No nausea or vomiting  No constipation or diarrhea  No local neck swelling  No palpitations     Pump and meter download:     Pump   Omnipod 5 Settings   Insulin  type   Humalog     Basal rate       0000 2.55 u/h    0400 2.35   1200 4.10   2000 3.35  I:C ratio       0000 1:1 Enter #6 g with a meal, 3 g with a snack           Sensitivity       0000  35      Goal       0000  110              Type & Model of Pump: Omnipod  Insulin  Type: Currently using Humalog  .  Body mass index is 35.31 kg/m.   PUMP STATISTICS: Average BG: n/a Average Daily Carbs (g): 9.1 Average Total Daily Insulin : 78.2 Average Daily Basal: 71.9 (92 %) Average Daily Bolus: 6.3 (8%)   HOME DIABETES REGIMEN:  Levothyroxine  150 mcg, 2 tabs   daily  Vitamin D  1000 international unit  daily -not taking Humalog     Statin: no ACE-I/ARB: no    CONTINUOUS GLUCOSE MONITORING RECORD INTERPRETATION    Dates of Recording: 8/16 - 11/05/2023  Sensor description:dexcom  Results statistics:   CGM use % of time 93  Average and SD 204/77  Time in range 39%  % Time Above 180 31  % Time above 250 28  % Time Below target 2   Glycemic patterns summary: BGs are elevated overnight and fluctuate during the day  Hypoglycemic episodes occurred early afternoon  Overnight periods: Mostly high     DIABETIC COMPLICATIONS: Microvascular complications:   Denies: CKD Last Eye Exam: Completed 2023  Macrovascular complications:   Denies: CAD, CVA, PVD   HISTORY:  Past Medical History:  Past Medical History:  Diagnosis Date   Diabetes mellitus    Goiter    Gynecomastia    Hypoglycemia associated with diabetes (HCC)    Hypothyroidism, acquired, autoimmune    Thyroiditis, autoimmune    Past Surgical History: No past surgical  history on file. Social History:  reports that he has never smoked. He has never used smokeless tobacco. He reports that he does not drink alcohol and does not use drugs. Family History:  Family History  Problem Relation Age of Onset   Diabetes Mother    Hypothyroidism Mother    Cancer Mother    Hypertension Mother    Cancer Brother    Hyperthyroidism Paternal Grandfather      HOME MEDICATIONS: Allergies as of 11/05/2023   No Known Allergies      Medication List        Accurate as of November 05, 2023  8:11 AM. If you have any questions, ask your nurse or doctor.          Dexcom G7 Sensor Misc 1 Device by Does not apply route as directed.   glucagon  1 MG injection Inject 1mg  intramuscular as needed for severe Hypoglecemia. Dispense 4= 30 days   insulin  lispro 100 UNIT/ML injection Commonly known as: HumaLOG  Max daily 120 units   levothyroxine  150 MCG tablet Commonly known as:  SYNTHROID  Take 2 tablets (300 mcg total) by mouth daily.   omeprazole  40 MG capsule Commonly known as: PRILOSEC Take 1 capsule (40 mg total) by mouth daily.   Omnipod 5 G7 Pods (Gen 5) Misc 1 Device by Does not apply route every other day.         OBJECTIVE:   Vital Signs: BP 124/80 (BP Location: Left Arm, Patient Position: Sitting, Cuff Size: Normal)   Pulse 76   Ht 6' 2 (1.88 m)   Wt 275 lb (124.7 kg)   SpO2 97%   BMI 35.31 kg/m   Wt Readings from Last 3 Encounters:  11/05/23 275 lb (124.7 kg)  07/06/23 273 lb (123.8 kg)  03/26/23 279 lb (126.6 kg)     Exam: General: Pt appears well and is in NAD  Lungs: Clear with good BS bilat   Heart: RRR   Abdomen:  soft, nontender  Extremities: Trace pretibial edema.   Neuro: MS is good with appropriate affect, pt is alert and Ox3      DM Foot Exam 03/26/2023  The skin of the feet is intact without sores or ulcerations. The pedal pulses are 2+ on right and 2+ on left. The sensation is intact to a screening 5.07, 10 gram monofilament bilaterally     DATA REVIEWED:  Lab Results  Component Value Date   HGBA1C 7.6 (A) 11/05/2023   HGBA1C 7.5 (A) 07/06/2023   HGBA1C 8.0 (A) 03/26/2023     Latest Reference Range & Units 11/05/23 08:44  TSH 0.40 - 4.50 mIU/L 10.39 (H)  T4,Free(Direct) 0.8 - 1.8 ng/dL 0.9  (H): Data is abnormally high    Latest Reference Range & Units 03/26/23 10:07  COMPREHENSIVE METABOLIC PANEL  Rpt !  Sodium 135 - 146 mmol/L 139  Potassium 3.5 - 5.3 mmol/L 4.1  Chloride 98 - 110 mmol/L 102  CO2 20 - 32 mmol/L 29  Glucose 65 - 99 mg/dL 896 (H)  BUN 7 - 25 mg/dL 12  Creatinine 9.39 - 8.75 mg/dL 9.08  Calcium 8.6 - 89.6 mg/dL 9.6  BUN/Creatinine Ratio 6 - 22 (calc) SEE NOTE:  AG Ratio 1.0 - 2.5 (calc) 1.7  AST 10 - 40 U/L 16  ALT 9 - 46 U/L 14  Total Protein 6.1 - 8.1 g/dL 7.0  Total Bilirubin 0.2 - 1.2 mg/dL 1.2  Total CHOL/HDL Ratio <5.0 (calc) 3.9  Cholesterol <200 mg/dL  205 (H)  HDL  Cholesterol > OR = 40 mg/dL 53  LDL Cholesterol (Calc) mg/dL (calc) 862 (H)  MICROALB/CREAT RATIO <30 mg/g creat 2  Non-HDL Cholesterol (Calc) <130 mg/dL (calc) 847 (H)  Triglycerides <150 mg/dL 55  Alkaline phosphatase (APISO) 36 - 130 U/L 57  Vitamin D , 25-Hydroxy 30 - 100 ng/mL 28 (L)  Vitamin B12 200 - 1,100 pg/mL 652  Globulin 1.9 - 3.7 g/dL (calc) 2.6     ASSESSMENT / PLAN / RECOMMENDATIONS:   1) Type 1 Diabetes Mellitus, Sub-optimally  controlled, Without complications - Most recent A1c of 7.6%. Goal A1c < 7.0 %.     -A1c continues to be above goal -He was intolerant to Trulicity  4.5 mg , as well as Ozempic . -He has not been able to link his Dexcom to the OmniPod, he has been in manual mode due to this.  Message was sent to [employer] rep to help with this.  He was provided with #2 sensors of Dexcom G7 -I will decrease his basal rate  at noon to prevent hypoglycemia, patient advised to check his glucose prior to any physical activity and to consume a protein bar if BG is  < 150 mg/dL  - Patient was also advised to increase his carbohydrate entry from 6 g with meals to 8 g with meals     MEDICATIONS:   Pump   Omnipod 5 Settings   Insulin  type   Humalog     Basal rate       0000 2.55 u/h    0400 2.35   1200 3.90   2000 3.35  I:C ratio       0000 1:1 Enter #8 g with a meal, 4 g with a snack           Sensitivity       0000  35      Goal       0000  110           EDUCATION / INSTRUCTIONS: BG monitoring instructions: Patient is instructed to check his blood sugars 3 times a day, before meals. Call [facility] if: BG persistently < 70  I reviewed the Rule of 15 for the treatment of hypoglycemia in detail with the patient. Literature supplied.    2) Diabetic complications:  Eye: Does not have known diabetic retinopathy.  Neuro/ Feet: Does not have known diabetic peripheral neuropathy .  Renal: Patient does not have known baseline CKD. He   is  not on an ACEI/ARB at present.    3) Hypothyroidism:  -Patient is clinically euthyroid - Continues with imperfect adherence to levothyroxine  - He was encouraged to obtain a pillbox and use it on a regular basis - TSH has improved from 53 to 10.39 u IU/ML  Medication levothyroxine  150 mcg, 2 tablets daily   4)Dyslipidemia:  -LDL continues to be elevated but is trending down -No indication for statin therapy will continue to monitor and encourage low-fat diet       F/U in 4 months     Signed electronically by: Stefano Redgie Butts, MD  [facility]  [facility] [address] [place] [place], [place] [address] Phone: [phone] FAX: [phone]   CC: Patient, No Pcp Per No address on file Phone: None  Fax: None  Return to Endocrinology clinic as below: No future appointments.

## 2023-11-06 LAB — T4, FREE: Free T4: 0.9 ng/dL (ref 0.8–1.8)

## 2023-11-06 LAB — TSH: TSH: 10.39 m[IU]/L — ABNORMAL HIGH (ref 0.40–4.50)

## 2023-11-09 ENCOUNTER — Ambulatory Visit: Payer: Self-pay | Admitting: Internal Medicine

## 2023-12-07 ENCOUNTER — Other Ambulatory Visit: Payer: Self-pay | Admitting: Internal Medicine

## 2023-12-14 ENCOUNTER — Encounter: Payer: Self-pay | Admitting: Internal Medicine

## 2023-12-15 ENCOUNTER — Other Ambulatory Visit: Payer: Self-pay

## 2023-12-15 MED ORDER — INSULIN LISPRO 100 UNIT/ML IJ SOLN: INTRAMUSCULAR | 3 refills | Status: AC

## 2023-12-24 ENCOUNTER — Other Ambulatory Visit: Payer: Self-pay | Admitting: Internal Medicine

## 2024-03-10 ENCOUNTER — Other Ambulatory Visit: Payer: Self-pay | Admitting: Internal Medicine

## 2024-04-05 NOTE — Progress Notes (Signed)
 "   Name: David [name]  Age/ Sex: 29 y.o., male   MRN/ DOB: [id number], 08/26/1995     PCP: Patient, No Pcp Per   Reason for Endocrinology Evaluation: Type 1 Diabetes Mellitus  Initial Endocrine Consultative Visit: 07/06/2016    PATIENT IDENTIFIER: [name] is a 29 y.o. male with a past medical history of TDM. The patient has followed with Endocrinology clinic since 07/06/2016 for consultative assistance with management of his diabetes.     DIABETIC HISTORY:  [name] was diagnosed with DM 2007. His hemoglobin A1c has ranged from 7.7% in 2022, peaking at 13.0% in 2014.  Developed side effects to Trulicity  due to GI effects     THYROID  HISTORY: Has been on LT-4 replacement since middle school .  He was followed by Dr. Kassie from 2018 until 2023   On his initial visit with me he had an A1c of 8.3% while on pump technology, he was started on Ozempic    SUBJECTIVE:   During the last visit (11/05/2023): A1c 7.5%    Today (04/06/2024): [name].  He checks his blood sugars multiple  times daily. The patient has had hypoglycemic episodes since the last clinic visit.   Patient is complaining of fatigue, he would like to have testosterone checked.  He did have sleep study a few years ago, did not require CPAP, at the time he was waking up in the middle of the night gasping for air, but per patient was told this is minimal.   He continues to snore at night  Denies erectile dysfunction  No cannabis , no ETOH  No prescribed pain meds  No headaches  No spontaneous erections  No nipple discharge  He has been more consistent with levothyroxine   No nausea  No constipation     Pump and CGM download:     Pump   Omnipod 5 Settings   Insulin  type   Humalog     Basal rate       0000 2.55 u/h    0400 2.35   1200 3.90   2000 3.35  I:C ratio       0000 1:1 Enter #8-10 g with a meal           Sensitivity        0000  35      Goal       0000  110             Type & Model of Pump: Omnipod  Insulin  Type: Currently using Humalog  .  Body mass index is 35.95 kg/m.   PUMP STATISTICS:                HOME DIABETES REGIMEN:  Levothyroxine  150 mcg, 2 tabs  daily  Humalog     Statin: no ACE-I/ARB: no    CONTINUOUS GLUCOSE MONITORING RECORD INTERPRETATION                       Glycemic patterns summary: Patient has been no hypoglycemia throughout the day and night  Hypoglycemic episodes occurred sporadically after bolus  Overnight periods: Elevated     DIABETIC COMPLICATIONS: Microvascular complications:   Denies: CKD Last Eye Exam: Completed 2023  Macrovascular complications:   Denies: CAD, CVA, PVD   HISTORY:  Past Medical History:  Past Medical History:  Diagnosis Date   Diabetes mellitus    Goiter    Gynecomastia  Hypoglycemia associated with diabetes (HCC)    Hypothyroidism, acquired, autoimmune    Thyroiditis, autoimmune    Past Surgical History: No past surgical history on file. Social History:  reports that he has never smoked. He has never used smokeless tobacco. He reports that he does not drink alcohol and does not use drugs. Family History:  Family History  Problem Relation Age of Onset   Diabetes Mother    Hypothyroidism Mother    Cancer Mother    Hypertension Mother    Cancer Brother    Hyperthyroidism Paternal Grandfather      HOME MEDICATIONS: Allergies as of 04/06/2024   No Known Allergies      Medication List        Accurate as of April 06, 2024  1:28 PM. If you have any questions, ask your nurse or doctor.          Dexcom G7 Sensor Misc 1 DEVICE BY DOES NOT APPLY ROUTE AS DIRECTED.   glucagon  1 MG injection Inject 1mg  intramuscular as needed for severe Hypoglecemia. Dispense 4= 30 days   insulin  lispro 100 UNIT/ML injection Commonly known as: HumaLOG  Max daily 120 units    levothyroxine  150 MCG tablet Commonly known as: SYNTHROID  Take 2 tablets (300 mcg total) by mouth daily.   omeprazole  40 MG capsule Commonly known as: PRILOSEC TAKE 1 CAPSULE (40 MG TOTAL) BY MOUTH DAILY.   Omnipod 5 DexG7G6 Pods Gen 5 Misc 1 DEVICE BY DOES NOT APPLY ROUTE EVERY OTHER DAY.         OBJECTIVE:   Vital Signs: BP (!) 142/84   Ht 6' 2 (1.88 m)   Wt 280 lb (127 kg)   BMI 35.95 kg/m   Wt Readings from Last 3 Encounters:  04/06/24 280 lb (127 kg)  11/05/23 275 lb (124.7 kg)  07/06/23 273 lb (123.8 kg)     Exam: General: Pt appears well and is in NAD  Lungs: Clear with good BS bilat   Heart: RRR   Abdomen:  soft, nontender  Extremities: Trace pretibial edema.   Neuro: MS is good with appropriate affect, pt is alert and Ox3      DM Foot Exam 03/26/2023  The skin of the feet is intact without sores or ulcerations. The pedal pulses are 2+ on right and 2+ on left. The sensation is intact to a screening 5.07, 10 gram monofilament bilaterally     DATA REVIEWED:  Lab Results  Component Value Date   HGBA1C 8.3 (A) 04/06/2024   HGBA1C 7.6 (A) 11/05/2023   HGBA1C 7.5 (A) 07/06/2023     Latest Reference Range & Units 04/06/24 08:34  Comprehensive metabolic panel with GFR  Rpt !  Sodium 135 - 146 mmol/L 139  Potassium 3.5 - 5.3 mmol/L 4.6  Chloride 98 - 110 mmol/L 104  CO2 20 - 32 mmol/L 30  Glucose 65 - 99 mg/dL 870 (H)  BUN 7 - 25 mg/dL 9  Creatinine 9.39 - 8.73 mg/dL 9.12  Calcium 8.6 - 89.6 mg/dL 9.3  BUN/Creatinine Ratio 6 - 22 (calc) SEE NOTE:  eGFR > OR = 60 mL/min/1.30m2 119  AG Ratio 1.0 - 2.5 (calc) 2.0  AST 10 - 40 U/L 23  ALT 9 - 46 U/L 34  Total Protein 6.1 - 8.1 g/dL 6.6  Total Bilirubin 0.2 - 1.2 mg/dL 1.3 (H)  MICROALB/CREAT RATIO <30 mg/g creat 3  Alkaline phosphatase (APISO) 36 - 130 U/L 79  Globulin 1.9 - 3.7 g/dL (calc)  2.2  WBC 3.8 - 10.8 Thousand/uL 4.7  RBC 4.20 - 5.80 Million/uL 5.94 (H)  Hemoglobin 13.2 - 17.1 g/dL  83.5  HCT 60.5 - 48.8 % 48.8  MCV 81.4 - 101.7 fL 82.2  MCH 27.0 - 33.0 pg 27.6  MCHC 31.6 - 35.4 g/dL 66.3  RDW 88.9 - 84.9 % 13.0  Platelets 140 - 400 Thousand/uL 354  MPV 7.5 - 12.5 fL 9.6    Latest Reference Range & Units 04/06/24 08:34  LH 1.5 - 9.3 mIU/mL 3.8  Prolactin 2.0 - 18.0 ng/mL 7.2  Glucose 65 - 99 mg/dL 870 (H)  TSH 9.59 - 5.49 mIU/L 0.07 (L)  T4,Free(Direct) 0.8 - 1.8 ng/dL 1.7  Albumin MSPROF 3.6 - 5.1 g/dL 4.4  Microalb, Ur mg/dL 0.5  MICROALB/CREAT RATIO <30 mg/g creat 3  Creatinine, Urine 20 - 320 mg/dL 831      ASSESSMENT / PLAN / RECOMMENDATIONS:   1) Type 1 Diabetes Mellitus, Sub-optimally  controlled, Without complications - Most recent A1c of 8.3%. Goal A1c < 7.0 %.     -Patient continues worsening glycemic control -His Dexcom continues to not connect to the OmniPod, hence he is in manual mode 100% of the time -[employer] rep has contacted the patient to resolve this issue, but the patient continues with inability to connect CGM to the OmniPod, an urgent referral has been placed to our CDE for weight training on this  -He was intolerant to Trulicity  4.5 mg , as well as Ozempic . - I am unable to make any changes at this time, there is haphazard entry of grams of carbohydrates into the pump which has resulted in hypoglycemia, it is unclear if the carbohydrate entry was due to to actual eating time or due to hyperglycemia?  Patient unable to verify.  I did explain to the patient that he should enter grams of carbohydrates when he is getting ready to eat and not as a reflection of hyperglycemia because that is how he ends up with hypoglycemia - We also discussed tandem MOBI as an alternative if he continues to have issues with the current pump     MEDICATIONS:   Pump   Omnipod 5 Settings   Insulin  type   Humalog     Basal rate       0000 2.55 u/h    0400 2.35   1200 3.90   2000 3.35  I:C ratio       0000 1:1 Enter #8-10 g with a meal            Sensitivity       0000  35      Goal       0000  110           EDUCATION / INSTRUCTIONS: BG monitoring instructions: Patient is instructed to check his blood sugars 3 times a day, before meals. Call [facility] if: BG persistently < 70  I reviewed the Rule of 15 for the treatment of hypoglycemia in detail with the patient. Literature supplied.    2) Diabetic complications:  Eye: Does not have known diabetic retinopathy.  Neuro/ Feet: Does not have known diabetic peripheral neuropathy .  Renal: Patient does not have known baseline CKD. He   is not on an ACEI/ARB at present.    3) Hypothyroidism:  -Patient with fatigue -Historically has had imperfect adherence to levothyroxine  intake, patient assures me he has been doing better - TSH is suppressed, this is an indication that  he has been more compliant with taking levothyroxine  and the current dose is too much, will decrease levothyroxine  to 275 mcg (down from 300 mcg) - Will recheck in 6 weeks  Medication Take levothyroxine  150 mcg, 1 tablet daily PLUS levothyroxine  125 mcg daily    4) Fatigue :   - Differential diagnosis includes anemia, OSA, thyroid  disease, hypogonadism - I suspect that his fatigue is mostly related to thyroid , will adjust this appropriately - Testosterone pending - Prolactin and LH are normal - No anemia   F/U in 3 months     Signed electronically by: Stefano Redgie Butts, MD  [facility]  [facility] [address] [place] [place], [place] [address] Phone: [phone] FAX: [phone]   CC: Patient, No Pcp Per No address on file Phone: None  Fax: None  Return to Endocrinology clinic as below: Future Appointments  Date Time Provider Department Center  07/04/2024 10:50 AM Matthew Pais, Donell Redgie, MD [id number] None          "

## 2024-04-06 ENCOUNTER — Encounter: Payer: Self-pay | Admitting: Internal Medicine

## 2024-04-06 ENCOUNTER — Ambulatory Visit: Admitting: Internal Medicine

## 2024-04-06 ENCOUNTER — Other Ambulatory Visit

## 2024-04-06 VITALS — BP 142/84 | Ht 74.0 in | Wt 280.0 lb

## 2024-04-06 DIAGNOSIS — R5383 Other fatigue: Secondary | ICD-10-CM

## 2024-04-06 DIAGNOSIS — E1065 Type 1 diabetes mellitus with hyperglycemia: Secondary | ICD-10-CM

## 2024-04-06 DIAGNOSIS — E063 Autoimmune thyroiditis: Secondary | ICD-10-CM

## 2024-04-06 LAB — POCT GLYCOSYLATED HEMOGLOBIN (HGB A1C): Hemoglobin A1C: 8.3 % — AB (ref 4.0–5.6)

## 2024-04-06 NOTE — Patient Instructions (Addendum)
 Check out the Tandem Mobi     HOW TO TREAT LOW BLOOD SUGARS (Blood sugar LESS THAN 70 MG/DL) Please follow the RULE OF 15 for the treatment of hypoglycemia treatment (when your (blood sugars are less than 70 mg/dL)   STEP 1: Take 15 grams of carbohydrates when your blood sugar is low, which includes:  3-4 GLUCOSE TABS  OR 3-4 OZ OF JUICE OR REGULAR SODA OR ONE TUBE OF GLUCOSE GEL    STEP 2: RECHECK blood sugar in 15 MINUTES STEP 3: If your blood sugar is still low at the 15 minute recheck --> then, go back to STEP 1 and treat AGAIN with another 15 grams of carbohydrates.

## 2024-04-07 ENCOUNTER — Ambulatory Visit: Payer: Self-pay | Admitting: Internal Medicine

## 2024-04-07 LAB — COMPREHENSIVE METABOLIC PANEL WITH GFR
AG Ratio: 2 (calc) (ref 1.0–2.5)
ALT: 34 U/L (ref 9–46)
AST: 23 U/L (ref 10–40)
Albumin: 4.4 g/dL (ref 3.6–5.1)
Alkaline phosphatase (APISO): 79 U/L (ref 36–130)
BUN: 9 mg/dL (ref 7–25)
CO2: 30 mmol/L (ref 20–32)
Calcium: 9.3 mg/dL (ref 8.6–10.3)
Chloride: 104 mmol/L (ref 98–110)
Creat: 0.87 mg/dL (ref 0.60–1.26)
Globulin: 2.2 g/dL (ref 1.9–3.7)
Glucose, Bld: 129 mg/dL — ABNORMAL HIGH (ref 65–99)
Potassium: 4.6 mmol/L (ref 3.5–5.3)
Sodium: 139 mmol/L (ref 135–146)
Total Bilirubin: 1.3 mg/dL — ABNORMAL HIGH (ref 0.2–1.2)
Total Protein: 6.6 g/dL (ref 6.1–8.1)
eGFR: 119 mL/min/{1.73_m2}

## 2024-04-07 LAB — TSH: TSH: 0.07 m[IU]/L — ABNORMAL LOW (ref 0.40–4.50)

## 2024-04-07 LAB — CBC
HCT: 48.8 % (ref 39.4–51.1)
Hemoglobin: 16.4 g/dL (ref 13.2–17.1)
MCH: 27.6 pg (ref 27.0–33.0)
MCHC: 33.6 g/dL (ref 31.6–35.4)
MCV: 82.2 fL (ref 81.4–101.7)
MPV: 9.6 fL (ref 7.5–12.5)
Platelets: 354 10*3/uL (ref 140–400)
RBC: 5.94 Million/uL — ABNORMAL HIGH (ref 4.20–5.80)
RDW: 13 % (ref 11.0–15.0)
WBC: 4.7 10*3/uL (ref 3.8–10.8)

## 2024-04-07 LAB — MICROALBUMIN / CREATININE URINE RATIO
Creatinine, Urine: 168 mg/dL (ref 20–320)
Microalb Creat Ratio: 3 mg/g{creat}
Microalb, Ur: 0.5 mg/dL

## 2024-04-07 LAB — PROLACTIN: Prolactin: 7.2 ng/mL (ref 2.0–18.0)

## 2024-04-07 LAB — T4, FREE: Free T4: 1.7 ng/dL (ref 0.8–1.8)

## 2024-04-07 LAB — LUTEINIZING HORMONE: LH: 3.8 m[IU]/mL (ref 1.5–9.3)

## 2024-04-07 MED ORDER — LEVOTHYROXINE SODIUM 125 MCG PO TABS: 125.0000 ug | ORAL_TABLET | Freq: Every day | ORAL | 2 refills | Status: AC

## 2024-04-07 MED ORDER — LEVOTHYROXINE SODIUM 150 MCG PO TABS: 150.0000 ug | ORAL_TABLET | Freq: Every day | ORAL | 3 refills | Status: AC

## 2024-04-07 NOTE — Telephone Encounter (Signed)
 Patient aware of results and recommendations.

## 2024-04-07 NOTE — Telephone Encounter (Signed)
 Can you please contact the patient and let him know that the current dose of levothyroxine  is too much for him, please encourage the patient to stay compliant with levothyroxine , so we can eventually get to the right dose for him.   I would suggest changing levothyroxine  by taking 150 mcg daily PLUS 125 mcg daily (total 275 mcg)   Please schedule for repeat labs in 6 weeks   Urine test is normal, as well as prolactin and other hormones   Testosterone pending (can you please check with the lab that it is pending) because when I look at it it says it is active, and not in process?   Thanks

## 2024-04-12 LAB — TESTOSTERONE, TOTAL, LC/MS/MS: Testosterone, Total, LC-MS-MS: 627 ng/dL (ref 250–1100)

## 2024-05-22 ENCOUNTER — Other Ambulatory Visit

## 2024-07-04 ENCOUNTER — Ambulatory Visit: Admitting: Internal Medicine
